# Patient Record
Sex: Female | Born: 1970
Health system: Southern US, Community
[De-identification: ages and names within clinical notes are randomized; demographics above are authoritative.]

## PROBLEM LIST (undated history)

## (undated) DIAGNOSIS — D649 Anemia, unspecified: Secondary | ICD-10-CM

## (undated) DIAGNOSIS — J189 Pneumonia, unspecified organism: Secondary | ICD-10-CM

## (undated) HISTORY — PX: ABDOMINAL HYSTERECTOMY: SHX81

---

## 1992-10-15 DIAGNOSIS — J189 Pneumonia, unspecified organism: Secondary | ICD-10-CM

## 1992-10-15 HISTORY — DX: Pneumonia, unspecified organism: J18.9

## 1998-08-12 ENCOUNTER — Other Ambulatory Visit: Admission: RE | Admit: 1998-08-12 | Discharge: 1998-08-12 | Payer: Self-pay | Admitting: Obstetrics and Gynecology

## 1999-09-12 ENCOUNTER — Other Ambulatory Visit: Admission: RE | Admit: 1999-09-12 | Discharge: 1999-09-12 | Payer: Self-pay | Admitting: *Deleted

## 2000-09-24 ENCOUNTER — Other Ambulatory Visit: Admission: RE | Admit: 2000-09-24 | Discharge: 2000-09-24 | Payer: Self-pay | Admitting: *Deleted

## 2001-10-15 HISTORY — PX: RHINOPLASTY: SUR1284

## 2001-10-28 ENCOUNTER — Other Ambulatory Visit: Admission: RE | Admit: 2001-10-28 | Discharge: 2001-10-28 | Payer: Self-pay | Admitting: Obstetrics and Gynecology

## 2002-10-30 ENCOUNTER — Other Ambulatory Visit: Admission: RE | Admit: 2002-10-30 | Discharge: 2002-10-30 | Payer: Self-pay | Admitting: Obstetrics and Gynecology

## 2003-11-03 ENCOUNTER — Other Ambulatory Visit: Admission: RE | Admit: 2003-11-03 | Discharge: 2003-11-03 | Payer: Self-pay | Admitting: Obstetrics and Gynecology

## 2006-02-12 ENCOUNTER — Encounter: Admission: RE | Admit: 2006-02-12 | Discharge: 2006-02-12 | Payer: Self-pay | Admitting: Obstetrics & Gynecology

## 2008-08-25 ENCOUNTER — Encounter: Admission: RE | Admit: 2008-08-25 | Discharge: 2008-08-25 | Payer: Self-pay | Admitting: Certified Nurse Midwife

## 2009-10-15 HISTORY — PX: TUBAL LIGATION: SHX77

## 2009-10-15 HISTORY — PX: NOVASURE ABLATION: SHX5394

## 2010-06-08 ENCOUNTER — Encounter: Admission: RE | Admit: 2010-06-08 | Discharge: 2010-06-08 | Payer: Self-pay | Admitting: Obstetrics & Gynecology

## 2010-10-05 ENCOUNTER — Ambulatory Visit (HOSPITAL_COMMUNITY)
Admission: RE | Admit: 2010-10-05 | Discharge: 2010-10-05 | Payer: Self-pay | Source: Home / Self Care | Attending: Obstetrics | Admitting: Obstetrics

## 2010-12-25 LAB — SURGICAL PCR SCREEN
MRSA, PCR: NEGATIVE
Staphylococcus aureus: NEGATIVE

## 2010-12-25 LAB — ABO/RH: ABO/RH(D): A POS

## 2010-12-25 LAB — CBC
HCT: 34.1 % — ABNORMAL LOW (ref 36.0–46.0)
Hemoglobin: 11.3 g/dL — ABNORMAL LOW (ref 12.0–15.0)

## 2011-04-25 ENCOUNTER — Encounter (HOSPITAL_COMMUNITY): Payer: Self-pay

## 2011-04-25 ENCOUNTER — Encounter (HOSPITAL_COMMUNITY)
Admission: RE | Admit: 2011-04-25 | Discharge: 2011-04-25 | Disposition: A | Discharge status: Home / Self Care | Payer: 59 | Source: Ambulatory Visit | Attending: Obstetrics | Admitting: Obstetrics

## 2011-04-25 HISTORY — DX: Pneumonia, unspecified organism: J18.9

## 2011-04-25 HISTORY — DX: Anemia, unspecified: D64.9

## 2011-04-25 LAB — CBC
HCT: 34.7 % — ABNORMAL LOW (ref 36.0–46.0)
MCV: 93.5 fL (ref 78.0–100.0)
Platelets: 245 10*3/uL (ref 150–400)
RDW: 14 % (ref 11.5–15.5)

## 2011-04-25 LAB — BASIC METABOLIC PANEL
GFR calc non Af Amer: >60 mL/min (ref >60)
Glucose, Bld: 85 mg/dL (ref 70–99)
Potassium: 4.5 mEq/L (ref 3.5–5.1)

## 2011-04-25 LAB — SURGICAL PCR SCREEN: MRSA, PCR: NEGATIVE

## 2011-04-25 NOTE — Patient Instructions (Signed)
19 MYR[address] DAHAN  04/25/2011   Your procedure is scheduled on:  05/01/2011  Report to [facility] at 1:00 PM.  Call this number if you have problems the morning of surgery: [phone]   Remember:   Do not eat food:After Midnight.  Do not drink clear liquids: 4 Hours before arrival.  Take these medicines the morning of surgery with A SIP OF WATER: None   Do not wear jewelry, make-up or nail polish.  Do not bring valuables to the hospital.  Contacts, dentures or bridgework may not be worn into surgery.  Leave suitcase in the car. After surgery it may be brought to your room.  For patients admitted to the hospital, checkout time is 11:00 AM the day of discharge.   Patients discharged the day of surgery will not be allowed to drive home.  Name and phone number of your driver: Khila  Special Instructions:Hibiclens bath the night before your surgery and the morning of your surgery.   Please read over the following fact sheets that you were given: Pain Booklet and MRSA Information

## 2011-05-01 ENCOUNTER — Encounter (HOSPITAL_COMMUNITY): Payer: Self-pay | Admitting: Anesthesiology

## 2011-05-01 ENCOUNTER — Ambulatory Visit (HOSPITAL_COMMUNITY): Payer: 59 | Admitting: Anesthesiology

## 2011-05-01 ENCOUNTER — Encounter (HOSPITAL_COMMUNITY)
Admission: RE | Disposition: A | Discharge status: Home / Self Care | Payer: Self-pay | Source: Ambulatory Visit | Attending: Obstetrics

## 2011-05-01 ENCOUNTER — Encounter (HOSPITAL_COMMUNITY): Payer: Self-pay | Admitting: Obstetrics

## 2011-05-01 ENCOUNTER — Ambulatory Visit (HOSPITAL_COMMUNITY)
Admission: RE | Admit: 2011-05-01 | Discharge: 2011-05-02 | Disposition: A | Discharge status: Home / Self Care | Payer: 59 | Source: Ambulatory Visit | Attending: Obstetrics | Admitting: Obstetrics

## 2011-05-01 ENCOUNTER — Other Ambulatory Visit: Payer: Self-pay | Admitting: Obstetrics

## 2011-05-01 DIAGNOSIS — R102 Pelvic and perineal pain unspecified side: Secondary | ICD-10-CM | POA: Diagnosis present

## 2011-05-01 DIAGNOSIS — Z01818 Encounter for other preprocedural examination: Secondary | ICD-10-CM | POA: Insufficient documentation

## 2011-05-01 DIAGNOSIS — N949 Unspecified condition associated with female genital organs and menstrual cycle: Secondary | ICD-10-CM | POA: Insufficient documentation

## 2011-05-01 DIAGNOSIS — N857 Hematometra: Secondary | ICD-10-CM | POA: Diagnosis present

## 2011-05-01 DIAGNOSIS — Z01812 Encounter for preprocedural laboratory examination: Secondary | ICD-10-CM | POA: Insufficient documentation

## 2011-05-01 LAB — TYPE AND SCREEN: Antibody Screen: NEGATIVE

## 2011-05-01 LAB — PREGNANCY, URINE: Preg Test, Ur: NEGATIVE

## 2011-05-01 SURGERY — ROBOTIC ASSISTED TOTAL HYSTERECTOMY
Anesthesia: General | Site: Abdomen | Wound class: Clean Contaminated

## 2011-05-01 MED ORDER — CEFAZOLIN SODIUM 1-5 GM-% IV SOLN
INTRAVENOUS | Status: DC | PRN
  Administered 2011-05-01: 1 g via INTRAVENOUS

## 2011-05-01 MED ORDER — FENTANYL CITRATE 0.05 MG/ML IJ SOLN
INTRAMUSCULAR | Status: DC | PRN
  Administered 2011-05-01: 100 ug via INTRAVENOUS
  Administered 2011-05-01: 50 ug via INTRAVENOUS
  Administered 2011-05-01: 100 ug via INTRAVENOUS

## 2011-05-01 MED ORDER — LACTATED RINGERS IV SOLN
INTRAVENOUS | Status: DC
  Administered 2011-05-01: 13:00:00 via INTRAVENOUS

## 2011-05-01 MED ORDER — KETOROLAC TROMETHAMINE 30 MG/ML IJ SOLN
30.0000 mg | Freq: Four times a day (QID) | INTRAMUSCULAR | Status: DC
  Administered 2011-05-01 – 2011-05-02 (×2): 30 mg via INTRAVENOUS
  Filled 2011-05-01 (×2): qty 1

## 2011-05-01 MED ORDER — HYDROMORPHONE HCL 1 MG/ML IJ SOLN
INTRAMUSCULAR | Status: DC | PRN
  Administered 2011-05-01: 1 mg via INTRAVENOUS

## 2011-05-01 MED ORDER — MORPHINE SULFATE 2 MG/ML IJ SOLN: 1.0000 mg | INTRAMUSCULAR | Status: DC | PRN

## 2011-05-01 MED ORDER — LACTATED RINGERS IR SOLN
Status: DC | PRN
  Administered 2011-05-01: 3000 mL

## 2011-05-01 MED ORDER — SCOPOLAMINE 1 MG/3DAYS TD PT72
MEDICATED_PATCH | TRANSDERMAL | Status: AC
  Filled 2011-05-01: qty 1

## 2011-05-01 MED ORDER — ONDANSETRON HCL 4 MG/2ML IJ SOLN
INTRAMUSCULAR | Status: DC | PRN
  Administered 2011-05-01: 4 mg via INTRAVENOUS

## 2011-05-01 MED ORDER — FENTANYL CITRATE 0.05 MG/ML IJ SOLN
25.0000 ug | INTRAMUSCULAR | Status: DC | PRN
  Administered 2011-05-01 (×4): 50 ug via INTRAVENOUS

## 2011-05-01 MED ORDER — ACETAMINOPHEN 325 MG PO TABS: 650.0000 mg | ORAL_TABLET | ORAL | Status: DC | PRN

## 2011-05-01 MED ORDER — ZOLPIDEM TARTRATE 5 MG PO TABS: 5.0000 mg | ORAL_TABLET | Freq: Every evening | ORAL | Status: DC | PRN

## 2011-05-01 MED ORDER — BUPIVACAINE HCL (PF) 0.25 % IJ SOLN
INTRAMUSCULAR | Status: DC | PRN
  Administered 2011-05-01: 14 mL

## 2011-05-01 MED ORDER — ONDANSETRON HCL 4 MG PO TABS: 4.0000 mg | ORAL_TABLET | Freq: Four times a day (QID) | ORAL | Status: DC | PRN

## 2011-05-01 MED ORDER — FENTANYL CITRATE 0.05 MG/ML IJ SOLN
INTRAMUSCULAR | Status: AC
  Administered 2011-05-01: 50 ug via INTRAVENOUS
  Filled 2011-05-01: qty 2

## 2011-05-01 MED ORDER — LIDOCAINE HCL (CARDIAC) 20 MG/ML IV SOLN
INTRAVENOUS | Status: DC | PRN
  Administered 2011-05-01: 80 mg via INTRAVENOUS

## 2011-05-01 MED ORDER — LACTATED RINGERS IV SOLN
INTRAVENOUS | Status: DC | PRN
  Administered 2011-05-01 (×3): via INTRAVENOUS

## 2011-05-01 MED ORDER — KETOROLAC TROMETHAMINE 30 MG/ML IJ SOLN
30.0000 mg | Freq: Once | INTRAMUSCULAR | Status: AC
  Administered 2011-05-01: 30 mg via INTRAVENOUS

## 2011-05-01 MED ORDER — SCOPOLAMINE 1 MG/3DAYS TD PT72: 1.0000 | MEDICATED_PATCH | Freq: Once | TRANSDERMAL | Status: DC

## 2011-05-01 MED ORDER — SODIUM CHLORIDE 0.9 % IV SOLN
INTRAVENOUS | Status: AC
  Administered 2011-05-01: 19:00:00 via INTRAVENOUS

## 2011-05-01 MED ORDER — MORPHINE SULFATE 4 MG/ML IJ SOLN: 1.0000 mg | INTRAMUSCULAR | Status: DC | PRN

## 2011-05-01 MED ORDER — ROCURONIUM BROMIDE 100 MG/10ML IV SOLN
INTRAVENOUS | Status: DC | PRN
  Administered 2011-05-01: 10 mg via INTRAVENOUS
  Administered 2011-05-01: 50 mg via INTRAVENOUS
  Administered 2011-05-01: 10 mg via INTRAVENOUS

## 2011-05-01 MED ORDER — ONDANSETRON HCL 4 MG/2ML IJ SOLN: 4.0000 mg | Freq: Four times a day (QID) | INTRAMUSCULAR | Status: DC | PRN

## 2011-05-01 MED ORDER — PROPOFOL 10 MG/ML IV EMUL
INTRAVENOUS | Status: DC | PRN
  Administered 2011-05-01: 200 mg via INTRAVENOUS
  Administered 2011-05-01: 100 mg via INTRAVENOUS

## 2011-05-01 MED ORDER — NEOSTIGMINE METHYLSULFATE 1 MG/ML IJ SOLN
INTRAMUSCULAR | Status: DC | PRN
  Administered 2011-05-01: 3 mg via INTRAMUSCULAR

## 2011-05-01 MED ORDER — MEPERIDINE HCL 25 MG/ML IJ SOLN: 6.2500 mg | INTRAMUSCULAR | Status: DC | PRN

## 2011-05-01 MED ORDER — OXYCODONE-ACETAMINOPHEN 5-325 MG PO TABS
1.0000 | ORAL_TABLET | ORAL | Status: DC | PRN
  Administered 2011-05-01: 1 via ORAL
  Administered 2011-05-02: 2 via ORAL
  Filled 2011-05-01: qty 1
  Filled 2011-05-01: qty 2

## 2011-05-01 MED ORDER — MORPHINE SULFATE 4 MG/ML IJ SOLN
2.0000 mg | Freq: Once | INTRAMUSCULAR | Status: AC
  Administered 2011-05-01: 2 mg via INTRAVENOUS
  Filled 2011-05-01: qty 1

## 2011-05-01 MED ORDER — KETOROLAC TROMETHAMINE 30 MG/ML IJ SOLN
INTRAMUSCULAR | Status: AC
  Administered 2011-05-01: 30 mg via INTRAVENOUS
  Filled 2011-05-01: qty 1

## 2011-05-01 MED ORDER — CEFAZOLIN SODIUM 1-5 GM-% IV SOLN: 1.0000 g | INTRAVENOUS | Status: DC

## 2011-05-01 MED ORDER — GLYCOPYRROLATE 0.2 MG/ML IJ SOLN
INTRAMUSCULAR | Status: DC | PRN
  Administered 2011-05-01: .4 mg via INTRAVENOUS

## 2011-05-01 MED ORDER — MENTHOL 3 MG MT LOZG: 1.0000 | LOZENGE | OROMUCOSAL | Status: DC | PRN

## 2011-05-01 MED ORDER — KETOROLAC TROMETHAMINE 30 MG/ML IJ SOLN: 30.0000 mg | Freq: Four times a day (QID) | INTRAMUSCULAR | Status: DC

## 2011-05-01 MED ORDER — MIDAZOLAM HCL 5 MG/5ML IJ SOLN
INTRAMUSCULAR | Status: DC | PRN
  Administered 2011-05-01: 2 mg via INTRAVENOUS

## 2011-05-01 MED ORDER — DEXAMETHASONE SODIUM PHOSPHATE 10 MG/ML IJ SOLN
INTRAMUSCULAR | Status: DC | PRN
  Administered 2011-05-01: 10 mg via INTRAVENOUS

## 2011-05-01 SURGICAL SUPPLY — 66 items
BARRIER ADHS 3X4 INTERCEED (GAUZE/BANDAGES/DRESSINGS) ×1 IMPLANT
BLADE LAPAROSCOPIC MORCELL KIT (BLADE) ×1 IMPLANT
BLADELESS LONG 8MM (BLADE) IMPLANT
BRR ADH 4X3 ABS CNTRL BYND (GAUZE/BANDAGES/DRESSINGS)
CABLE HIGH FREQUENCY MONO STRZ (ELECTRODE) ×3 IMPLANT
CHLORAPREP W/TINT 26ML (MISCELLANEOUS) ×3 IMPLANT
CLOTH BEACON ORANGE TIMEOUT ST (SAFETY) ×3 IMPLANT
CONT PATH 16OZ SNAP LID 3702 (MISCELLANEOUS) ×3 IMPLANT
COVER MAYO STAND STRL (DRAPES) ×3 IMPLANT
COVER TABLE BACK 60X90 (DRAPES) ×6 IMPLANT
COVER TIP SHEARS 8 DVNC (MISCELLANEOUS) ×2 IMPLANT
COVER TIP SHEARS 8MM DA VINCI (MISCELLANEOUS) ×1
DECANTER SPIKE VIAL GLASS SM (MISCELLANEOUS) ×3 IMPLANT
DERMABOND ADVANCED (GAUZE/BANDAGES/DRESSINGS) ×6 IMPLANT
DRAPE HUG U DISPOSABLE (DRAPE) ×3 IMPLANT
DRAPE HYSTEROSCOPY (DRAPE) IMPLANT
DRAPE LG THREE QUARTER DISP (DRAPES) ×6 IMPLANT
DRAPE MONITOR DA VINCI (DRAPE) ×3 IMPLANT
DRAPE UTILITY XL STRL (DRAPES) ×1 IMPLANT
DRAPE WARM FLUID 44X44 (DRAPE) ×3 IMPLANT
ELECT REM PT RETURN 9FT ADLT (ELECTROSURGICAL) ×3
ELECTRODE REM PT RTRN 9FT ADLT (ELECTROSURGICAL) ×2 IMPLANT
EVACUATOR SMOKE 8.L (FILTER) ×3 IMPLANT
GAUZE VASELINE 3X9 (GAUZE/BANDAGES/DRESSINGS) IMPLANT
GLOVE BIO SURGEON STRL SZ 6.5 (GLOVE) ×7 IMPLANT
GLOVE BIOGEL PI IND STRL 7.0 (GLOVE) ×4 IMPLANT
GLOVE BIOGEL PI INDICATOR 7.0 (GLOVE) ×2
GOWN BRE IMP SLV AUR LG STRL (GOWN DISPOSABLE) ×16 IMPLANT
IV STOPCOCK 4 WAY 40  W/Y SET (IV SOLUTION)
IV STOPCOCK 4 WAY 40 W/Y SET (IV SOLUTION) ×1 IMPLANT
KIT DISP ACCESSORY 4 ARM (KITS) ×3 IMPLANT
NDL INSUFFLATION 14GA 120MM (NEEDLE) IMPLANT
NEEDLE HYPO 22GX1.5 SAFETY (NEEDLE) IMPLANT
NEEDLE INSUFFLATION 14GA 120MM (NEEDLE) ×3 IMPLANT
NS IRRIG 1000ML POUR BTL (IV SOLUTION) ×3 IMPLANT
OCCLUDER COLPOPNEUMO (BALLOONS) ×2 IMPLANT
PACK LAVH (CUSTOM PROCEDURE TRAY) ×3 IMPLANT
PAD PREP 24X48 CUFFED NSTRL (MISCELLANEOUS) ×6 IMPLANT
POSITIONER SURGICAL ARM (MISCELLANEOUS) ×10 IMPLANT
SET IRRIG TUBING LAPAROSCOPIC (IRRIGATION / IRRIGATOR) ×4 IMPLANT
SOLUTION ELECTROLUBE (MISCELLANEOUS) ×3 IMPLANT
SPONGE LAP 18X18 X RAY DECT (DISPOSABLE) IMPLANT
SUT VIC AB 0 CT1 27 (SUTURE) ×15
SUT VIC AB 0 CT1 27XBRD ANTBC (SUTURE) ×10 IMPLANT
SUT VIC AB 0 CT2 27 (SUTURE) ×6 IMPLANT
SUT VIC AB 2-0 CT1 27 (SUTURE)
SUT VIC AB 2-0 CT1 TAPERPNT 27 (SUTURE) IMPLANT
SUT VIC AB 4-0 PS2 27 (SUTURE) ×6 IMPLANT
SUT VICRYL 0 UR6 27IN ABS (SUTURE) ×6 IMPLANT
SYR 50ML LL SCALE MARK (SYRINGE) ×3 IMPLANT
SYR 50ML SLIP (SYRINGE) IMPLANT
SYSTEM CONVERTIBLE TROCAR (TROCAR) IMPLANT
TIP UTERINE 5.1X6CM LAV DISP (MISCELLANEOUS) IMPLANT
TIP UTERINE 6.7X10CM GRN DISP (MISCELLANEOUS) IMPLANT
TIP UTERINE 6.7X6CM WHT DISP (MISCELLANEOUS) IMPLANT
TIP UTERINE 6.7X8CM BLUE DISP (MISCELLANEOUS) ×2 IMPLANT
TOWEL OR 17X24 6PK STRL BLUE (TOWEL DISPOSABLE) ×9 IMPLANT
TRAY FOLEY BAG SILVER LF 14FR (CATHETERS) ×3 IMPLANT
TROCAR 12M 150ML BLUNT (TROCAR) ×2 IMPLANT
TROCAR DISP BLADELESS 8 DVNC (TROCAR) ×2 IMPLANT
TROCAR DISP BLADELESS 8MM (TROCAR) ×1
TROCAR XCEL 12X100 BLDLESS (ENDOMECHANICALS) ×3 IMPLANT
TROCAR Z-THREAD BLADED 12X100M (TROCAR) ×3 IMPLANT
TROCAR Z-THREAD FIOS 5X100MM (TROCAR) ×2 IMPLANT
TUBING FILTER THERMOFLATOR (ELECTROSURGICAL) ×4 IMPLANT
WARMER LAPAROSCOPE (MISCELLANEOUS) ×3 IMPLANT

## 2011-05-01 NOTE — Anesthesia Postprocedure Evaluation (Signed)
Vital signs stable Patient alert Pain and nausea are controlled No apparent anesthetic complications No follow up care needed 

## 2011-05-01 NOTE — Anesthesia Procedure Notes (Addendum)
Procedure Name: Intubation Date/Time: 05/01/2011 1:37 PM Performed by: Truitt Leep Pre-anesthesia Checklist: Patient identified, Emergency Drugs available, Suction available, Patient being monitored and Timeout performed Patient Re-evaluated:Patient Re-evaluated prior to inductionOxygen Delivery Method: Circle System Utilized Preoxygenation: Pre-oxygenation with 100% oxygen Intubation Type: IV induction Laryngoscope Size: Mac and 3 Grade View: Grade II Tube type: Oral Tube size: 7.0 mm Number of attempts: 1 Airway Equipment and Method: stylet Placement Confirmation: ETT inserted through vocal cords under direct vision,  positive ETCO2,  CO2 detector and breath sounds checked- equal and bilateral Secured at: 20 cm

## 2011-05-01 NOTE — H&P (Addendum)
Please see H&P from office under media tab. No new changes In short 6 months of pelvic pain and hematometra since Novasure ablation and lap TL. All other options exhausted and/or declined and pt desiring hysterectomy.  Darrold Span, MD  History and Physical Interval Note:   05/01/2011   6:25 PM   Theresa Mcclure  has presented today for surgery, with the diagnosis of pelvic pain;failed ablation  The various methods of treatment have been discussed with the patient and family. After consideration of risks, benefits and other options for treatment, the patient has consented to  Procedure(s): ROBOTIC ASSISTED TOTAL HYSTERECTOMY as a surgical intervention .  I have reviewed the patients' chart and labs.  Questions were answered to the patient's satisfaction.     Lendon Colonel  MD

## 2011-05-01 NOTE — Brief Op Note (Signed)
05/01/2011  5:18 PM  PATIENT:  Theresa Mcclure  40 y.o. female  PRE-OPERATIVE DIAGNOSIS:  pelvic pain;failed ablation  POST-OPERATIVE DIAGNOSIS:  same, hematometra, anterior omental adhesions  PROCEDURE:  Procedure(s): ROBOTIC ASSISTED TOTAL HYSTERECTOMY, b/l salpingectomy, lysis of adhesions  SURGEON:  Surgeon(s): Ryllie Nieland A. Bodin Gorka Vaishali R Mody  PHYSICIAN ASSISTANT:   ASSISTANTS: Hilary Hertz, MD   ANESTHESIA:   general  ESTIMATED BLOOD LOSS: 50cc  BLOOD ADMINISTERED:none  DRAINS: Urinary Catheter (Foley)   LOCAL MEDICATIONS USED:  MARCAINE 20 CC   SPECIMEN:  Source of Specimen:  uterus, cervix and b/l fallopian tubes  DISPOSITION OF SPECIMEN:  PATHOLOGY  COUNTS:  YES  TOURNIQUET:  * No tourniquets in log *  DICTATION #: [id number]  PLAN OF CARE: Routine post-op, admit overnight, prn pain meds, advance diet as tolerated, plan d/c home in am. Cont foley overnight and remove 6 am.   PATIENT DISPOSITION:  PACU - hemodynamically stable.   Delay start of Pharmacological VTE agent (>24hrs) due to surgical blood loss or risk of bleeding:  Yes, plan SCDs while in bed

## 2011-05-01 NOTE — Anesthesia Preprocedure Evaluation (Deleted)
Anesthesia Evaluation Anesthesia Physical Anesthesia Plan Anesthesia Quick Evaluation  

## 2011-05-01 NOTE — OR Nursing (Signed)
Rumi- [phone] Robotic times

## 2011-05-01 NOTE — Anesthesia Preprocedure Evaluation (Addendum)
Anesthesia Evaluation  Name, MR# and DOB Patient awake  General Assessment Comment  Reviewed: Allergy & Precautions, H&P  and Patient's Chart, lab work & pertinent test results  Airway Mallampati: II TM Distance: >3 FB Neck ROM: Full    Dental No notable dental hx (+) Teeth Intact   Pulmonaryneg pulmonary ROS  asthma    pulmonary exam normal   Cardiovascular Regular Normal   Neuro/PsychNegative Neurological ROS Negative Psych ROS  GI/Hepatic/Renal negative GI ROS, negative Liver ROS, and negative Renal ROS (+)       Endo/Other  Negative Endocrine ROS (+)   Abdominal   Musculoskeletal negative musculoskeletal ROS (+)  Hematology negative hematology ROS (+)   Peds  Reproductive/Obstetrics negative OB ROS   Anesthesia Other Findings             Anesthesia Physical Anesthesia Plan  ASA: I  Anesthesia Plan: General   Post-op Pain Management:    Induction: Intravenous  Airway Management Planned: Oral ETT  Additional Equipment:   Intra-op Plan:   Post-operative Plan: Extubation in OR  Informed Consent: I have reviewed the patients History and Physical, chart, labs and discussed the procedure including the risks, benefits and alternatives for the proposed anesthesia with the patient or authorized representative who has indicated his/her understanding and acceptance.   Dental advisory given  Plan Discussed with: Anesthesiologist (AP) and CRNA  Anesthesia Plan Comments:         Anesthesia Quick Evaluation

## 2011-05-01 NOTE — Transfer of Care (Signed)
Immediate Anesthesia Transfer of Care Note  Patient: Theresa Mcclure  Procedure(s) Performed:  ROBOTIC ASSISTED TOTAL HYSTERECTOMY - Lysis of adhesions, Bilateral Salpingectomy  Patient Location: PACU  Anesthesia Type: General  Level of Consciousness: awake, alert  and oriented  Airway & Oxygen Therapy: Patient Spontanous Breathing and Patient connected to nasal cannula oxygen  Post-op Assessment: Report given to PACU RN  Post vital signs: stable  Complications: No apparent anesthesia complications

## 2011-05-02 LAB — CBC
MCH: 30.5 pg (ref 26.0–34.0)
MCHC: 33.1 g/dL (ref 30.0–36.0)
Platelets: 235 10*3/uL (ref 150–400)
RBC: 3.15 MIL/uL — ABNORMAL LOW (ref 3.87–5.11)

## 2011-05-02 MED ORDER — OXYCODONE-ACETAMINOPHEN 5-325 MG PO TABS: 2.0000 | ORAL_TABLET | ORAL | Status: AC | PRN

## 2011-05-02 NOTE — Progress Notes (Signed)
1 Day Post-Op Procedure(s) (LRB): ROBOTIC ASSISTED TOTAL HYSTERECTOMY (N/A)  Subjective: Patient reports no problems voiding, tol reg po, no N/V. Pain controlled w/ po meds. Ambulating well. Vaginal spotting with wiping.  Ready for d/c.      Objective: I have reviewed patient's vital signs and labs.  General: alert and cooperative Resp: clear to auscultation bilaterally Cardio: regular rate and rhythm, S1, S2 normal, no murmur, click, rub or gallop GI: soft, slight distension, slight tympany, apprropriate tenderness POD ! Extremities: extremities normal, atraumatic, no cyanosis or edema Incisions healing well, minimal bruising  Assessment: s/p Procedure(s): ROBOTIC ASSISTED TOTAL HYSTERECTOMY: stable, progressing well and tolerating diet  Plan: Discharge home  LOS: 1 day    Ulice Follett A. 05/02/2011, 8:23 AM

## 2011-05-02 NOTE — Op Note (Signed)
Theresa Mcclure, Theresa Mcclure NO.:  [id number]  MEDICAL RECORD NO.:  [id number]  LOCATION:  9315                          FACILITY:  [facility]  PHYSICIAN:  Lendon Colonel, MD   DATE OF BIRTH:  March 29, 1971  DATE OF PROCEDURE:  05/01/2011 DATE OF DISCHARGE:                              OPERATIVE REPORT   PREOPERATIVE DIAGNOSES:  Pelvic pain, hematometra.  POSTOPERATIVE DIAGNOSIS:  Pelvic pain, hematometra.  PROCEDURE:  Robotic-assisted total laparoscopic hysterectomy.  SURGEON:  Lendon Colonel, MD  ASSISTANT:  Darryl Nestle, MD  ANESTHESIA:  General.  ESTIMATED BLOOD LOSS:  50 mL.  ANESTHESIA:  General.  FINDINGS:  Normal right and left ovary.  No evidence of ovarian cyst. Irregular-shaped uterus with bulge on the anterior abdominal wall filled with blood, evidence of prior tubal ligation, omental adhesion to the anterior abdominal wall.  ANESTHESIA:  General.  INDICATIONS:  This is a 40 year old, G2, P1 patient who is 6 months status post laparoscopic tubal ligation and NovaSure endometrial ablation.  Initial surgery was done for menorrhagia, however, postoperatively the patient began to develop cyclic debilitating monthly abdominal pain.  She was not having any vaginal bleeding at the time the patient had her pain.  Ultrasound showed a fluid-filled endometrium consistent with hematometra.  There was also at times hematosalpinx on ultrasound.  Additionally, there was question of a dermoid cyst on the right ovary.  The patient was consented for total laparoscopic hysterectomy with possible removal of the right ovarian cyst or right ovary.  PROCEDURE:  After informed consent was obtained, the patient was taken to the operating room where general anesthesia was initiated without difficulty.  She was prepped and draped in the normal sterile fashion in the dorsal supine lithotomy position.  A bimanual examination was done to assess the size and position of the  uterus.  A weighted speculum was placed in the vagina.  The cervix was grasped with a single-tooth tenaculum and the uterus was sounded to 8.5 cm.  The cervix was dilated.  The cervix was incised and decision was made to use a RUMI Koh medium ring with a #8 trocar.  This was inserted into the uterus without complication.  Foley catheter was inserted before any vaginal manipulation.  Gloves were changed and attention was turned to the patient's abdomen.  Given the prior surgery noting omental adhesions in the anterior abdominal wall, decision was made to start with a left upper quadrant port.  NG tube was in place.  A 5-mm skin incision was made after first infusing with 0.5% Marcaine in the left upper quadrant, Veress needle was inserted. Pneumoperitoneum was created to 15 mmHg.  A #5 non-bladed Optiview trocar was then placed under direct visualization.  Once in the abdomen, pneumoperitoneum was maintained.  Brief survey of the abdomen and pelvis was noted with finding of a large omental adhesion to the anterior abdominal wall and otherwise misshapen uterus with findings as above. Both ureters were visualized and seen starting at the pelvic brim crossing down into the pelvis.  Ovaries were evaluated and felt to be normal.  Decision was made to place additional ports.  The 8-mm skin incisions were made  after first infusing with 0.5% Marcaine and then direct insertion of non-bladed 8-mm trocars were made, one in the left lower quadrant, two in the right lower quadrant, and a 12 bladed trocar was used to enter at the abdomen around the omental adhesion.  Decision was then to take down the anterior omental adhesion with laparoscopy using a monopolar scissors and a Maryland grasper for traction.  Serial cautery and transection of the omental bands from the umbilical area was done.  After this was clear, the robot was docked to the ports and instruments were placed under direct visualization.   At this time, I moved to the robotic console.  Using robotic instruments, additional evaluation of the patient's abdomen and pelvis revealed findings as above.  The ureters were seen again.  We began on the right side.  The uterus was manipulated to the anterior abdominal wall into the patient's left and the right round ligament was grasped, cauterized, and transected.  Dissection was carried out along the anterior leaf of broad ligament until the bladder flap and bladder flap was begun to be created.  The uteroovarian ligament was serially cauterized and transected.  The tube was pulled up and the mesosalpinx was serially cauterized and transected so that the fallopian tube would be removed with the uterine specimen.  Some dissection along the posterior leaf of the broad ligament was also done until the ovary dropped away into the side.  Once hemostasis was noted, the uterus was deviated towards the patient's right.  The left round ligament was grasped, serially cauterized, and transected.  Some anterior dissection of the leaf of the broad ligament was done and carried around to the bladder flap.  This was done with the monopolar cautery and the bladder flap was created. The left uteroovarian ligament was serially cauterized and transected. The left mesosalpinx was serially cauterized and transected so that the entire fallopian tube remained with the uterine specimen.  Additional dissection of the anterior and posterior leaf of the broad ligaments were carried out and once the ovary had fallen away to the left side we focused again on the right side.  The right ovary was held up given the prior ultrasound with concern for possible dermoid.  The ovary was fully evaluated.  There was one small area which appeared to be cystic and this was cauterized and transected.  No cyst fluid could be seen.  The ovary appeared normal without excrescences, without any worrisome findings and decision was  made to leave the ovary being, not plan oophorectomy or further dissection into the ovary to find any small cyst that might be present.  With the uterus on stretch and the Koh ring pushed into the pelvis, the right uterine artery was serially cauterized and transected.  Additional dissection was done on the bladder flap and good blanching was seen of the uterus.  The uterus was stretched to the right and the left uterine artery was serially cauterized and transected.  Once good blanching of the uterus was noted, we began the dissection along the Koh ring using the monopolar scissors to transect the uterus at the cervicovaginal junction.  Good hemostasis was noted from the cuff; and the uterus, cervix, and bilateral fallopian tubes were pushed out of the vagina.  Good hemostasis was noted.  Irrigation was done.  At this point, two CT1 Vicryl sutures were placed through the umbilical port.  The camera was removed.  The sutures were placed.  The camera was replaced.  Both  needles were then grasped and anchored to the anterior abdominal wall, and these CT1 sutures were used to throw figure- of-eight sutures at the bilateral angles of the vaginal cuff.  After the sutures were tightened and the suture was cut, the sutures were then again anchored to the anterior abdominal wall.  Decision was then made to proceed with CT2 sutures.  Serially single CT suture was placed to the left lower quadrant port and grasped with the robotic instrument and used to close the cuff.  Three additional figure-of-eight sutures were placed along the vaginal cuff.  Excellent hemostasis was noted from the vaginal cuff.  All cut pedicles were inspected and found to be hemostatic.  As the sutures that were anchored into the anterior abdominal wall were removed, some bleeding was noted and bipolar cautery was used to control bleeding at these sites.  The robot was removed. The laparoscopy was used to remove the two  additional sutures that were anchored in the anterior abdominal wall.  Irrigation was carried out. The patient was placed in reverse Trendelenburg.  Again, irrigation was done.  All cut pedicles and vaginal cuff remained hemostatic, and all instruments were then removed.  The umbilical incision and its fascial incision were closed with a single suture of 0 Vicryl.  The 4-0 Vicryl was then used to close the subcu layers on the all ports.  The patient tolerated the procedure well.  Sponge, lap, and needle counts were correct x3 and the patient was taken to the recovery room in stable condition.     Lendon Colonel, MD     KAF/MEDQ  D:  05/01/2011  T:  05/02/2011  Job:  [id number]

## 2013-05-18 ENCOUNTER — Other Ambulatory Visit: Payer: Self-pay | Admitting: Obstetrics and Gynecology

## 2013-05-18 ENCOUNTER — Other Ambulatory Visit: Payer: Self-pay | Admitting: Obstetrics & Gynecology

## 2013-05-18 DIAGNOSIS — Z1231 Encounter for screening mammogram for malignant neoplasm of breast: Secondary | ICD-10-CM

## 2013-06-04 ENCOUNTER — Ambulatory Visit
Admission: RE | Admit: 2013-06-04 | Discharge: 2013-06-04 | Disposition: A | Discharge status: Home / Self Care | Payer: 59 | Source: Ambulatory Visit | Attending: Obstetrics and Gynecology | Admitting: Obstetrics and Gynecology

## 2013-06-04 DIAGNOSIS — Z1231 Encounter for screening mammogram for malignant neoplasm of breast: Secondary | ICD-10-CM

## 2017-03-05 ENCOUNTER — Emergency Department (HOSPITAL_COMMUNITY): Payer: Managed Care, Other (non HMO)

## 2017-03-05 ENCOUNTER — Encounter (HOSPITAL_COMMUNITY): Payer: Self-pay

## 2017-03-05 DIAGNOSIS — R079 Chest pain, unspecified: Secondary | ICD-10-CM | POA: Diagnosis not present

## 2017-03-05 DIAGNOSIS — Z5321 Procedure and treatment not carried out due to patient leaving prior to being seen by health care provider: Secondary | ICD-10-CM | POA: Insufficient documentation

## 2017-03-05 LAB — CBC
HEMATOCRIT: 35.3 % — AB (ref 36.0–46.0)
HEMOGLOBIN: 11.4 g/dL — AB (ref 12.0–15.0)
MCH: 29.5 pg (ref 26.0–34.0)
MCHC: 32.3 g/dL (ref 30.0–36.0)
MCV: 91.2 fL (ref 78.0–100.0)
Platelets: 232 10*3/uL (ref 150–400)
RBC: 3.87 MIL/uL (ref 3.87–5.11)
RDW: 13.6 % (ref 11.5–15.5)
WBC: 7.1 10*3/uL (ref 4.0–10.5)

## 2017-03-05 LAB — BASIC METABOLIC PANEL
ANION GAP: 6 (ref 5–15)
BUN: 15 mg/dL (ref 6–20)
CHLORIDE: 105 mmol/L (ref 101–111)
CO2: 23 mmol/L (ref 22–32)
Calcium: 9.1 mg/dL (ref 8.9–10.3)
Creatinine, Ser: 1 mg/dL (ref 0.44–1.00)
GFR calc Af Amer: >60 mL/min (ref >60)
GLUCOSE: 93 mg/dL (ref 65–99)
Potassium: 3.8 mmol/L (ref 3.5–5.1)
Sodium: 134 mmol/L — ABNORMAL LOW (ref 135–145)

## 2017-03-05 LAB — I-STAT TROPONIN, ED: Troponin i, poc: 0 ng/mL (ref 0.00–0.08)

## 2017-03-05 NOTE — ED Triage Notes (Signed)
Pt reports a dullness to the right side of her chest that radiates down to her right elbow, onset today around 1430 while she was sitting down at work. Denies SOB/n/v/d/diaphoresis. Pt sent from urgent care

## 2017-03-06 ENCOUNTER — Emergency Department (HOSPITAL_COMMUNITY)
Admission: EM | Admit: 2017-03-06 | Discharge: 2017-03-06 | Disposition: A | Discharge status: Home / Self Care | Payer: Managed Care, Other (non HMO) | Attending: Emergency Medicine | Admitting: Emergency Medicine

## 2017-03-06 NOTE — ED Triage Notes (Signed)
The pt is tired of waitinot angry jung  She is the wait   Time given  [facility] and [facility] for further treatemnt.. She has an appointment with her regular doctor  Later today  She asked if they could get her results and they can  She left  Not upset just tired of waiting

## 2018-11-04 DIAGNOSIS — J069 Acute upper respiratory infection, unspecified: Secondary | ICD-10-CM | POA: Diagnosis not present

## 2018-11-04 DIAGNOSIS — H1030 Unspecified acute conjunctivitis, unspecified eye: Secondary | ICD-10-CM | POA: Diagnosis not present

## 2018-11-24 DIAGNOSIS — Z1231 Encounter for screening mammogram for malignant neoplasm of breast: Secondary | ICD-10-CM | POA: Diagnosis not present

## 2018-11-24 DIAGNOSIS — Z6829 Body mass index (BMI) 29.0-29.9, adult: Secondary | ICD-10-CM | POA: Diagnosis not present

## 2018-11-24 DIAGNOSIS — Z01419 Encounter for gynecological examination (general) (routine) without abnormal findings: Secondary | ICD-10-CM | POA: Diagnosis not present

## 2019-05-15 ENCOUNTER — Other Ambulatory Visit: Payer: Self-pay

## 2019-05-15 DIAGNOSIS — R6889 Other general symptoms and signs: Secondary | ICD-10-CM | POA: Diagnosis not present

## 2019-05-15 DIAGNOSIS — Z20822 Contact with and (suspected) exposure to covid-19: Secondary | ICD-10-CM

## 2019-05-17 LAB — NOVEL CORONAVIRUS, NAA: SARS-CoV-2, NAA: NOT DETECTED

## 2019-05-25 ENCOUNTER — Other Ambulatory Visit: Payer: Self-pay | Admitting: Family Medicine

## 2019-05-25 ENCOUNTER — Ambulatory Visit
Admission: RE | Admit: 2019-05-25 | Discharge: 2019-05-25 | Disposition: A | Discharge status: Home / Self Care | Payer: BC Managed Care – PPO | Source: Ambulatory Visit | Attending: Family Medicine | Admitting: Family Medicine

## 2019-05-25 ENCOUNTER — Other Ambulatory Visit: Payer: Self-pay

## 2019-05-25 DIAGNOSIS — R0789 Other chest pain: Secondary | ICD-10-CM

## 2019-05-25 DIAGNOSIS — R0781 Pleurodynia: Secondary | ICD-10-CM | POA: Diagnosis not present

## 2019-09-21 DIAGNOSIS — Z03818 Encounter for observation for suspected exposure to other biological agents ruled out: Secondary | ICD-10-CM | POA: Diagnosis not present

## 2019-10-21 DIAGNOSIS — Z03818 Encounter for observation for suspected exposure to other biological agents ruled out: Secondary | ICD-10-CM | POA: Diagnosis not present

## 2019-12-04 DIAGNOSIS — Z01419 Encounter for gynecological examination (general) (routine) without abnormal findings: Secondary | ICD-10-CM | POA: Diagnosis not present

## 2019-12-04 DIAGNOSIS — B001 Herpesviral vesicular dermatitis: Secondary | ICD-10-CM | POA: Diagnosis not present

## 2019-12-04 DIAGNOSIS — Z6833 Body mass index (BMI) 33.0-33.9, adult: Secondary | ICD-10-CM | POA: Diagnosis not present

## 2019-12-04 DIAGNOSIS — N760 Acute vaginitis: Secondary | ICD-10-CM | POA: Diagnosis not present

## 2019-12-04 DIAGNOSIS — N952 Postmenopausal atrophic vaginitis: Secondary | ICD-10-CM | POA: Diagnosis not present

## 2019-12-04 DIAGNOSIS — Z1231 Encounter for screening mammogram for malignant neoplasm of breast: Secondary | ICD-10-CM | POA: Diagnosis not present

## 2019-12-04 DIAGNOSIS — F489 Nonpsychotic mental disorder, unspecified: Secondary | ICD-10-CM | POA: Diagnosis not present

## 2019-12-07 ENCOUNTER — Ambulatory Visit: Payer: BC Managed Care – PPO | Attending: Internal Medicine

## 2019-12-07 DIAGNOSIS — Z20822 Contact with and (suspected) exposure to covid-19: Secondary | ICD-10-CM | POA: Diagnosis not present

## 2019-12-08 LAB — NOVEL CORONAVIRUS, NAA: SARS-CoV-2, NAA: NOT DETECTED

## 2020-01-01 ENCOUNTER — Ambulatory Visit: Payer: BC Managed Care – PPO | Attending: Internal Medicine

## 2020-01-01 DIAGNOSIS — Z23 Encounter for immunization: Secondary | ICD-10-CM

## 2020-01-01 NOTE — Progress Notes (Signed)
   Covid-19 Vaccination Clinic  Name:  Theresa Mcclure    MRN: [id number] DOB: 12/31/1970  01/01/2020  [name] was observed post Covid-19 immunization for 15 minutes without incident. She was provided with Vaccine Information Sheet and instruction to access the V-Safe system.   [name] was instructed to call 911 with any severe reactions post vaccine: Marland Kitchen Difficulty breathing  . Swelling of face and throat  . A fast heartbeat  . A bad rash all over body  . Dizziness and weakness   Immunizations Administered    Name Date Dose VIS Date Route   Pfizer COVID-19 Vaccine 01/01/2020  2:05 PM 0.3 mL 09/25/2019 Intramuscular   Manufacturer: [employer], [employer]   Lot: [id number]   NDC: [address]-6

## 2020-01-27 ENCOUNTER — Ambulatory Visit: Payer: BC Managed Care – PPO | Attending: Internal Medicine

## 2020-01-27 DIAGNOSIS — Z23 Encounter for immunization: Secondary | ICD-10-CM

## 2020-01-27 NOTE — Progress Notes (Signed)
   Covid-19 Vaccination Clinic  Name:  Theresa Mcclure    MRN: [id number] DOB: 09-27-1971  01/27/2020  Theresa Mcclure was observed post Covid-19 immunization for 15 minutes without incident. She was provided with Vaccine Information Sheet and instruction to access the V-Safe system.   Theresa Mcclure was instructed to call 911 with any severe reactions post vaccine: Marland Kitchen Difficulty breathing  . Swelling of face and throat  . A fast heartbeat  . A bad rash all over body  . Dizziness and weakness   Immunizations Administered    Name Date Dose VIS Date Route   Pfizer COVID-19 Vaccine 01/27/2020  9:22 AM 0.3 mL 09/25/2019 Intramuscular   Manufacturer: [employer], [employer]   Lot: [id number]   NDC: [address]-5

## 2020-04-01 DIAGNOSIS — Z20822 Contact with and (suspected) exposure to covid-19: Secondary | ICD-10-CM | POA: Diagnosis not present

## 2020-04-25 DIAGNOSIS — E559 Vitamin D deficiency, unspecified: Secondary | ICD-10-CM | POA: Diagnosis not present

## 2020-04-25 DIAGNOSIS — Z13220 Encounter for screening for lipoid disorders: Secondary | ICD-10-CM | POA: Diagnosis not present

## 2020-04-25 DIAGNOSIS — R7303 Prediabetes: Secondary | ICD-10-CM | POA: Diagnosis not present

## 2020-04-25 DIAGNOSIS — M2550 Pain in unspecified joint: Secondary | ICD-10-CM | POA: Diagnosis not present

## 2020-05-18 DIAGNOSIS — M7711 Lateral epicondylitis, right elbow: Secondary | ICD-10-CM | POA: Diagnosis not present

## 2020-05-18 DIAGNOSIS — M2550 Pain in unspecified joint: Secondary | ICD-10-CM | POA: Diagnosis not present

## 2020-05-18 DIAGNOSIS — R7982 Elevated C-reactive protein (CRP): Secondary | ICD-10-CM | POA: Diagnosis not present

## 2020-05-18 DIAGNOSIS — R5383 Other fatigue: Secondary | ICD-10-CM | POA: Diagnosis not present

## 2020-06-15 DIAGNOSIS — E790 Hyperuricemia without signs of inflammatory arthritis and tophaceous disease: Secondary | ICD-10-CM | POA: Diagnosis not present

## 2020-06-15 DIAGNOSIS — M150 Primary generalized (osteo)arthritis: Secondary | ICD-10-CM | POA: Diagnosis not present

## 2020-06-15 DIAGNOSIS — M2550 Pain in unspecified joint: Secondary | ICD-10-CM | POA: Diagnosis not present

## 2020-07-18 ENCOUNTER — Other Ambulatory Visit: Payer: BC Managed Care – PPO

## 2020-07-18 DIAGNOSIS — Z20822 Contact with and (suspected) exposure to covid-19: Secondary | ICD-10-CM | POA: Diagnosis not present

## 2020-07-20 LAB — NOVEL CORONAVIRUS, NAA: SARS-CoV-2, NAA: NOT DETECTED

## 2020-07-20 LAB — SARS-COV-2, NAA 2 DAY TAT

## 2020-09-30 ENCOUNTER — Ambulatory Visit: Payer: BC Managed Care – PPO | Attending: Internal Medicine

## 2020-09-30 DIAGNOSIS — Z23 Encounter for immunization: Secondary | ICD-10-CM

## 2020-09-30 NOTE — Progress Notes (Signed)
   Covid-19 Vaccination Clinic  Name:  Theresa Mcclure    MRN: [id number] DOB: [birth year]  09/30/2020  [name] was observed post Covid-19 immunization for 15 minutes without incident. She was provided with Vaccine Information Sheet and instruction to access the V-Safe system.   [name] was instructed to call 911 with any severe reactions post vaccine: Marland Kitchen Difficulty breathing  . Swelling of face and throat  . A fast heartbeat  . A bad rash all over body  . Dizziness and weakness   Immunizations Administered    Name Date Dose VIS Date Route   Pfizer COVID-19 Vaccine 09/30/2020  3:07 PM 0.3 mL 08/03/2020 Intramuscular   Manufacturer: [employer], [employer]   Lot: [id number]   NDC: [address]-4

## 2021-03-20 ENCOUNTER — Encounter: Payer: Self-pay | Admitting: Emergency Medicine

## 2021-03-20 ENCOUNTER — Ambulatory Visit
Admission: EM | Admit: 2021-03-20 | Discharge: 2021-03-20 | Disposition: A | Discharge status: Home / Self Care | Payer: BC Managed Care – PPO | Attending: Emergency Medicine | Admitting: Emergency Medicine

## 2021-03-20 ENCOUNTER — Other Ambulatory Visit: Payer: Self-pay

## 2021-03-20 DIAGNOSIS — U071 COVID-19: Secondary | ICD-10-CM | POA: Diagnosis not present

## 2021-03-20 DIAGNOSIS — J209 Acute bronchitis, unspecified: Secondary | ICD-10-CM

## 2021-03-20 MED ORDER — ALBUTEROL SULFATE HFA 108 (90 BASE) MCG/ACT IN AERS: 1.0000 | INHALATION_SPRAY | Freq: Four times a day (QID) | RESPIRATORY_TRACT | 0 refills | Status: AC | PRN

## 2021-03-20 MED ORDER — BENZONATATE 200 MG PO CAPS: 200.0000 mg | ORAL_CAPSULE | Freq: Three times a day (TID) | ORAL | 0 refills | Status: AC | PRN

## 2021-03-20 MED ORDER — PROMETHAZINE-DM 6.25-15 MG/5ML PO SYRP: 5.0000 mL | ORAL_SOLUTION | Freq: Every evening | ORAL | 0 refills | Status: DC | PRN

## 2021-03-20 MED ORDER — PREDNISONE 20 MG PO TABS: 40.0000 mg | ORAL_TABLET | Freq: Every day | ORAL | 0 refills | Status: AC

## 2021-03-20 NOTE — ED Provider Notes (Signed)
[facility]    CSN: [id number] Arrival date & time: 03/20/21  1539      History   Chief Complaint Chief Complaint  Patient presents with  . Cough    HPI Theresa Mcclure is a 50 y.o. female presenting today for evaluation of shortness of breath and wheezing in setting of COVID.  Recently tested positive for COVID approximately 5 days ago.  Chills and body aches have resolved, but over the past 1 to 2 days has developed slightly worsening cough with chest discomfort wheezing and shortness of breath.  Reports history of childhood asthma, but denies problems as an adult.  HPI  Past Medical History:  Diagnosis Date  . Anemia    "slightly"  . Asthma    childhood  . Pneumonia 1994    Patient Active Problem List   Diagnosis Date Noted  . Pelvic pain in female 05/01/2011    Class: Acute  . Hematometra 05/01/2011    Past Surgical History:  Procedure Laterality Date  . ABDOMINAL HYSTERECTOMY    . CESAREAN SECTION  1995  . NOVASURE ABLATION  2011  . RHINOPLASTY  2003  . TUBAL LIGATION  2011    OB History   No obstetric history on file.      Home Medications    Prior to Admission medications   Medication Sig Start Date End Date Taking? Authorizing Provider  albuterol (VENTOLIN HFA) 108 (90 Base) MCG/ACT inhaler Inhale 1-2 puffs into the lungs every 6 (six) hours as needed for wheezing or shortness of breath. 03/20/21  Yes Johnni Wunschel C, PA-C  benzonatate (TESSALON) 200 MG capsule Take 1 capsule (200 mg total) by mouth 3 (three) times daily as needed for up to 7 days for cough. 03/20/21 03/27/21 Yes Jerico Grisso C, PA-C  predniSONE (DELTASONE) 20 MG tablet Take 2 tablets (40 mg total) by mouth daily with breakfast for 5 days. 03/20/21 03/25/21 Yes Sharla Tankard C, PA-C  promethazine-dextromethorphan (PROMETHAZINE-DM) 6.25-15 MG/5ML syrup Take 5 mLs by mouth at bedtime as needed for cough. 03/20/21  Yes Nikola Marone C, PA-C  Dextromethorphan-Guaifenesin  (MUCINEX DM PO) Take 1 tablet by mouth 1 day or 1 dose. Pt took medication for two days.     [provider]  ibuprofen (ADVIL,MOTRIN) 600 MG tablet Take 600 mg by mouth every 6 (six) hours as needed.      [provider]    Family History History reviewed. No pertinent family history.  Social History Social History   Tobacco Use  . Smoking status: Never Smoker  . Smokeless tobacco: Never Used  Substance Use Topics  . Alcohol use: Yes    Alcohol/week: 1.0 standard drink    Types: 1 Glasses of wine per week  . Drug use: No     Allergies   Patient has no known allergies.   Review of Systems Review of Systems  Constitutional: Negative for activity change, appetite change, chills, fatigue and fever.  HENT: Positive for congestion and rhinorrhea. Negative for ear pain, sinus pressure, sore throat and trouble swallowing.   Eyes: Negative for discharge and redness.  Respiratory: Positive for cough, chest tightness, shortness of breath and wheezing.   Cardiovascular: Negative for chest pain.  Gastrointestinal: Negative for abdominal pain, diarrhea, nausea and vomiting.  Musculoskeletal: Negative for myalgias.  Skin: Negative for rash.  Neurological: Negative for dizziness, light-headedness and headaches.     Physical Exam Triage Vital Signs ED Triage Vitals [03/20/21 1843]  Enc Vitals Group  BP 130/85     Pulse Rate 95     Resp 18     Temp 98.3 F (36.8 C)     Temp Source Oral     SpO2 97 %     Weight      Height      Head Circumference      Peak Flow      Pain Score 4     Pain Loc      Pain Edu?      Excl. in GC?    No data found.  Updated Vital Signs BP 130/85 (BP Location: Left Arm)   Pulse 95   Temp 98.3 F (36.8 C) (Oral)   Resp 18   LMP 04/13/2011   SpO2 97%   Visual Acuity Right Eye Distance:   Left Eye Distance:   Bilateral Distance:    Right Eye Near:   Left Eye Near:    Bilateral Near:     Physical Exam Vitals and  nursing note reviewed.  Constitutional:      Appearance: She is well-developed.     Comments: No acute distress  HENT:     Head: Normocephalic and atraumatic.     Ears:     Comments: Bilateral ears without tenderness to palpation of external auricle, tragus and mastoid, EAC's without erythema or swelling, TM's with good bony landmarks and cone of light. Non erythematous.     Nose: Nose normal.     Mouth/Throat:     Comments: Oral mucosa pink and moist, no tonsillar enlargement or exudate. Posterior pharynx patent and nonerythematous, no uvula deviation or swelling. Normal phonation. Eyes:     Conjunctiva/sclera: Conjunctivae normal.  Cardiovascular:     Rate and Rhythm: Normal rate and regular rhythm.  Pulmonary:     Effort: Pulmonary effort is normal. No respiratory distress.     Comments: Breathing comfortably at rest, mild coarseness on expiration and faint end expiratory wheezing bilaterally Abdominal:     General: There is no distension.  Musculoskeletal:        General: Normal range of motion.     Cervical back: Neck supple.  Skin:    General: Skin is warm and dry.  Neurological:     Mental Status: She is alert and oriented to person, place, and time.      UC Treatments / Results  Labs (all labs ordered are listed, but only abnormal results are displayed) Labs Reviewed - No data to display  EKG   Radiology No results found.  Procedures Procedures (including critical care time)  Medications Ordered in UC Medications - No data to display  Initial Impression / Assessment and Plan / UC Course  I have reviewed the triage vital signs and the nursing notes.  Pertinent labs & imaging results that were available during my care of the patient were reviewed by me and considered in my medical decision making (see chart for details).     Bronchitis in setting of COVID, prednisone and albuterol, Tessalon with Mucinex for daytime cough, Phenergan DM at nighttime.  Rest  and fluids.  Continue to monitor progress resolution of symptoms and continue to monitor breathing.  O2 stable.  Discussed strict return precautions. Patient verbalized understanding and is agreeable with plan.  Final Clinical Impressions(s) / UC Diagnoses   Final diagnoses:  COVID-19  Acute bronchitis, unspecified organism     Discharge Instructions     Begin prednisone daily x5 days-take with food and earlier in the  day if possible Albuterol inhaler 1 to 2 puffs every 4-6 hours as needed for shortness of breath, chest tightness and wheezing Tessalon every 8 hours for cough during the day, may use with Mucinex Phenergan DM syrup at bedtime Rest and fluids Follow-up if not improving or worsening    ED Prescriptions    Medication Sig Dispense Auth. Provider   predniSONE (DELTASONE) 20 MG tablet Take 2 tablets (40 mg total) by mouth daily with breakfast for 5 days. 10 tablet April Colter C, PA-C   benzonatate (TESSALON) 200 MG capsule Take 1 capsule (200 mg total) by mouth 3 (three) times daily as needed for up to 7 days for cough. 28 capsule Adelheid Hoggard C, PA-C   albuterol (VENTOLIN HFA) 108 (90 Base) MCG/ACT inhaler Inhale 1-2 puffs into the lungs every 6 (six) hours as needed for wheezing or shortness of breath. 1 each Leiland Mihelich C, PA-C   promethazine-dextromethorphan (PROMETHAZINE-DM) 6.25-15 MG/5ML syrup Take 5 mLs by mouth at bedtime as needed for cough. 118 mL [place] Hagger, [place] C, PA-C     PDMP not reviewed this encounter.   Lew Dawes, [place] 03/20/21 2102

## 2021-03-20 NOTE — Discharge Instructions (Addendum)
Begin prednisone daily x5 days-take with food and earlier in the day if possible Albuterol inhaler 1 to 2 puffs every 4-6 hours as needed for shortness of breath, chest tightness and wheezing Tessalon every 8 hours for cough during the day, may use with Mucinex Phenergan DM syrup at bedtime Rest and fluids Follow-up if not improving or worsening

## 2021-03-20 NOTE — ED Triage Notes (Signed)
Pt sts she tested positive for covid last Wednesday and now having increased cough and pain with cough; pt sts some wheezing earlier

## 2021-06-14 DIAGNOSIS — Z1231 Encounter for screening mammogram for malignant neoplasm of breast: Secondary | ICD-10-CM | POA: Diagnosis not present

## 2021-06-14 DIAGNOSIS — B373 Candidiasis of vulva and vagina: Secondary | ICD-10-CM | POA: Diagnosis not present

## 2021-06-14 DIAGNOSIS — N898 Other specified noninflammatory disorders of vagina: Secondary | ICD-10-CM | POA: Diagnosis not present

## 2021-10-13 ENCOUNTER — Other Ambulatory Visit: Payer: Self-pay

## 2021-10-13 ENCOUNTER — Ambulatory Visit
Admission: EM | Admit: 2021-10-13 | Discharge: 2021-10-13 | Disposition: A | Discharge status: Home / Self Care | Payer: BC Managed Care – PPO | Attending: Student | Admitting: Student

## 2021-10-13 ENCOUNTER — Ambulatory Visit (INDEPENDENT_AMBULATORY_CARE_PROVIDER_SITE_OTHER): Payer: BC Managed Care – PPO

## 2021-10-13 DIAGNOSIS — S99922A Unspecified injury of left foot, initial encounter: Secondary | ICD-10-CM | POA: Diagnosis not present

## 2021-10-13 DIAGNOSIS — M79675 Pain in left toe(s): Secondary | ICD-10-CM | POA: Diagnosis not present

## 2021-10-13 NOTE — ED Provider Notes (Signed)
[facility]    CSN: [id number] Arrival date & time: 10/13/21  0805      History   Chief Complaint Chief Complaint  Patient presents with   left pinky toe injury    HPI Theresa Mcclure is a 50 y.o. female presenting with left pinky toe pain following stubbing her toe on hamper 1 day ago.  Medical history noncontributory, denies history of issues  with this foot in the past.  Mild pain at rest, worsens with ambulation or bearing weight.  Denies sensation changes.  HPI  Past Medical History:  Diagnosis Date   Anemia    "slightly"   Asthma    childhood   Pneumonia 1994    Patient Active Problem List   Diagnosis Date Noted   Pelvic pain in female 05/01/2011    Class: Acute   Hematometra 05/01/2011    Past Surgical History:  Procedure Laterality Date   ABDOMINAL HYSTERECTOMY     CESAREAN SECTION  1995   NOVASURE ABLATION  2011   RHINOPLASTY  2003   TUBAL LIGATION  2011    OB History   No obstetric history on file.      Home Medications    Prior to Admission medications   Medication Sig Start Date End Date Taking? Authorizing Provider  albuterol (VENTOLIN HFA) 108 (90 Base) MCG/ACT inhaler Inhale 1-2 puffs into the lungs every 6 (six) hours as needed for wheezing or shortness of breath. 03/20/21   Wieters, Hallie C, PA-C  ibuprofen (ADVIL,MOTRIN) 600 MG tablet Take 600 mg by mouth every 6 (six) hours as needed.      [provider]    Family History History reviewed. No pertinent family history.  Social History Social History   Tobacco Use   Smoking status: Never   Smokeless tobacco: Never  Substance Use Topics   Alcohol use: Yes    Alcohol/week: 1.0 standard drink    Types: 1 Glasses of wine per week   Drug use: No     Allergies   Patient has no known allergies.   Review of Systems Review of Systems  Musculoskeletal:        L foot pain  All other systems reviewed and are negative.   Physical Exam Triage Vital  Signs ED Triage Vitals [10/13/21 0818]  Enc Vitals Group     BP (!) 148/88     Pulse Rate 81     Resp 18     Temp 97.7 F (36.5 C)     Temp Source Oral     SpO2 98 %     Weight      Height      Head Circumference      Peak Flow      Pain Score 5     Pain Loc      Pain Edu?      Excl. in GC?    No data found.  Updated Vital Signs BP (!) 148/88 (BP Location: Left Arm)    Pulse 81    Temp 97.7 F (36.5 C) (Oral)    Resp 18    LMP 04/13/2011    SpO2 98%   Visual Acuity Right Eye Distance:   Left Eye Distance:   Bilateral Distance:    Right Eye Near:   Left Eye Near:    Bilateral Near:     Physical Exam Vitals reviewed.  Constitutional:      General: She is not in acute distress.  Appearance: Normal appearance. She is not ill-appearing.  HENT:     Head: Normocephalic and atraumatic.  Pulmonary:     Effort: Pulmonary effort is normal.  Musculoskeletal:     Comments: L foot- no skin changes or effusion. No midfoot, malleolar, or metatarsal pain. TTP distal aspect L little toe. ROM toes intact and with minimal pain over little toe. Ambulating with pain.   Neurological:     General: No focal deficit present.     Mental Status: She is alert and oriented to person, place, and time.  Psychiatric:        Mood and Affect: Mood normal.        Behavior: Behavior normal.        Thought Content: Thought content normal.        Judgment: Judgment normal.     UC Treatments / Results  Labs (all labs ordered are listed, but only abnormal results are displayed) Labs Reviewed - No data to display  EKG   Radiology DG Toe 5th Left  Result Date: 10/13/2021 CLINICAL DATA:  Injury to the left fifth toe with pain. EXAM: DG TOE 5TH LEFT COMPARISON:  None. FINDINGS: The exam is limited due to the toes held in flexion. There is question lucency in the lateral aspect of the distal fifth phalanx. Fracture is not excluded. There is no dislocation. IMPRESSION: The exam is limited due  to the toes held in flexion. There is question lucency in the lateral aspect of the distal fifth phalanx. Fracture is not excluded. Electronically Signed   By: Sherian Rein M.D.   On: 10/13/2021 08:56    Procedures Procedures (including critical care time)  Medications Ordered in UC Medications - No data to display  Initial Impression / Assessment and Plan / UC Course  I have reviewed the triage vital signs and the nursing notes.  Pertinent labs & imaging results that were available during my care of the patient were reviewed by me and considered in my medical decision making (see chart for details).     This patient is a very pleasant 50 y.o. year old female presenting with possible L little toe fracture x1 day. Neurovascularly intact.   Xray L foot- The exam is limited due to the toes held in flexion. There is question lucency in the lateral aspect of the distal fifth phalanx. Fracture is not excluded.  Placed in post-op shoe, f/u with ortho in 1 week. RICE.  ED return precautions discussed. Patient verbalizes understanding and agreement.     Final Clinical Impressions(s) / UC Diagnoses   Final diagnoses:  Foot injury, left, initial encounter     Discharge Instructions      -Your x-rays showed a possible tiny fracture at the tip of your left pinky toe.  Please use the shoe until you follow-up with orthopedist in about 1 week. -You can take Tylenol up to 1000 mg 3 times daily, and ibuprofen up to 600 mg 3 times daily with food.  You can take these together, or alternate every 3-4 hours. -Rest, ice, elevation -Follow-up with an orthopedist in about 1 week. I recommend [facility] at [address] [place], [place] [address]. You can schedule an appointment by calling [phone]) or online ([email or web address]), but they also have a walk-in clinic M-F 8a-8p and Sat 10a-3p.      ED Prescriptions   None    PDMP not reviewed this encounter.   Rhys Martini,  PA-C 10/13/21 [phone]

## 2021-10-13 NOTE — Discharge Instructions (Addendum)
-  Your x-rays showed a possible tiny fracture at the tip of your left pinky toe.  Please use the shoe until you follow-up with orthopedist in about 1 week. -You can take Tylenol up to 1000 mg 3 times daily, and ibuprofen up to 600 mg 3 times daily with food.  You can take these together, or alternate every 3-4 hours. -Rest, ice, elevation -Follow-up with an orthopedist in about 1 week. I recommend [facility] at [address], [place], [place] [address]. You can schedule an appointment by calling [phone]) or online ([email or web address]), but they also have a walk-in clinic M-F 8a-8p and Sat 10a-3p.

## 2021-10-13 NOTE — ED Triage Notes (Signed)
Pt c/o injury to lest foot 5th digit after hitting it against object last night. States it mildly hurts constantly but worsens with pressure on the toe.

## 2021-10-23 DIAGNOSIS — Z0001 Encounter for general adult medical examination with abnormal findings: Secondary | ICD-10-CM | POA: Diagnosis not present

## 2021-10-23 DIAGNOSIS — Z23 Encounter for immunization: Secondary | ICD-10-CM | POA: Diagnosis not present

## 2021-10-23 DIAGNOSIS — E7800 Pure hypercholesterolemia, unspecified: Secondary | ICD-10-CM | POA: Diagnosis not present

## 2021-10-23 DIAGNOSIS — R7303 Prediabetes: Secondary | ICD-10-CM | POA: Diagnosis not present

## 2021-10-23 DIAGNOSIS — E559 Vitamin D deficiency, unspecified: Secondary | ICD-10-CM | POA: Diagnosis not present

## 2022-04-16 ENCOUNTER — Emergency Department (HOSPITAL_COMMUNITY): Payer: Commercial Managed Care - HMO

## 2022-04-16 ENCOUNTER — Other Ambulatory Visit: Payer: Self-pay

## 2022-04-16 ENCOUNTER — Emergency Department (HOSPITAL_COMMUNITY)
Admission: EM | Admit: 2022-04-16 | Discharge: 2022-04-17 | Discharge status: Against Medical Advice | Payer: Commercial Managed Care - HMO | Attending: Emergency Medicine | Admitting: Emergency Medicine

## 2022-04-16 ENCOUNTER — Encounter (HOSPITAL_COMMUNITY): Payer: Self-pay

## 2022-04-16 DIAGNOSIS — M79602 Pain in left arm: Secondary | ICD-10-CM | POA: Insufficient documentation

## 2022-04-16 DIAGNOSIS — Z5321 Procedure and treatment not carried out due to patient leaving prior to being seen by health care provider: Secondary | ICD-10-CM | POA: Diagnosis not present

## 2022-04-16 DIAGNOSIS — R079 Chest pain, unspecified: Secondary | ICD-10-CM | POA: Diagnosis present

## 2022-04-16 LAB — I-STAT BETA HCG BLOOD, ED (MC, WL, AP ONLY): I-stat hCG, quantitative: <5 m[IU]/mL (ref <5)

## 2022-04-16 LAB — CBC
HCT: 36.9 % (ref 36.0–46.0)
Hemoglobin: 11.7 g/dL — ABNORMAL LOW (ref 12.0–15.0)
MCH: 29.3 pg (ref 26.0–34.0)
MCHC: 31.7 g/dL (ref 30.0–36.0)
MCV: 92.5 fL (ref 80.0–100.0)
Platelets: 293 10*3/uL (ref 150–400)
RBC: 3.99 MIL/uL (ref 3.87–5.11)
RDW: 14.2 % (ref 11.5–15.5)
WBC: 7.6 10*3/uL (ref 4.0–10.5)
nRBC: 0 % (ref 0.0–0.2)

## 2022-04-16 NOTE — ED Triage Notes (Signed)
Pt reports with chest pain and left arm pain that started 90 minutes ago. Pt states that the pain is more like pressure and has mostly resolved.

## 2022-04-17 LAB — BASIC METABOLIC PANEL
Anion gap: 8 (ref 5–15)
BUN: 19 mg/dL (ref 6–20)
CO2: 22 mmol/L (ref 22–32)
Calcium: 9.4 mg/dL (ref 8.9–10.3)
Chloride: 111 mmol/L (ref 98–111)
Creatinine, Ser: 1.14 mg/dL — ABNORMAL HIGH (ref 0.44–1.00)
GFR, Estimated: 58 mL/min — ABNORMAL LOW (ref >60)
Glucose, Bld: 120 mg/dL — ABNORMAL HIGH (ref 70–99)
Potassium: 4.3 mmol/L (ref 3.5–5.1)
Sodium: 141 mmol/L (ref 135–145)

## 2022-04-17 LAB — TROPONIN I (HIGH SENSITIVITY): Troponin I (High Sensitivity): <2 ng/L (ref <18)

## 2022-04-17 NOTE — ED Notes (Signed)
Pt told registration that she was leaving.  

## 2023-03-13 IMAGING — DX DG TOE 5TH 2+V*L*
3 series · 3 of 3 positions shown · non-contrast
Comparison: None.

CLINICAL DATA: Injury to the left fifth toe with pain.

EXAM:
DG TOE 5TH LEFT

[toes dp (1 of 2)]
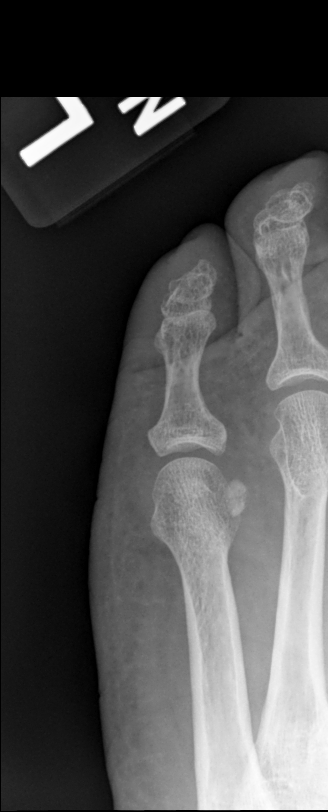

[toes dp (2 of 2)]
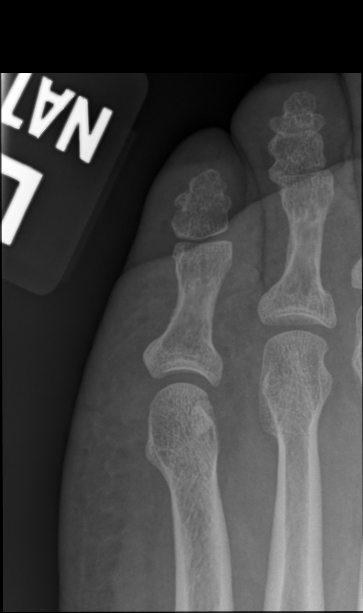

[toes lat]
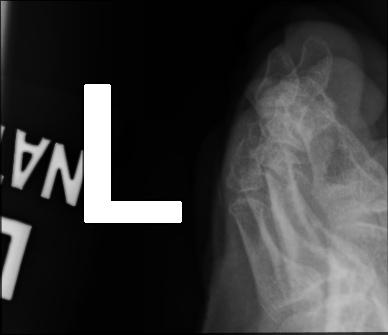

[3 of 3 positions shown; findings below may reference images not displayed]

FINDINGS: The exam is limited due to the toes held in flexion. There is
question lucency in the lateral aspect of the distal fifth phalanx.
Fracture is not excluded. There is no dislocation.
IMPRESSION: The exam is limited due to the toes held in flexion. There is
question lucency in the lateral aspect of the distal fifth phalanx.
Fracture is not excluded.

## 2023-04-12 DIAGNOSIS — M25561 Pain in right knee: Secondary | ICD-10-CM | POA: Diagnosis not present

## 2023-04-26 DIAGNOSIS — M25561 Pain in right knee: Secondary | ICD-10-CM | POA: Diagnosis not present

## 2023-05-10 DIAGNOSIS — Z1211 Encounter for screening for malignant neoplasm of colon: Secondary | ICD-10-CM | POA: Diagnosis not present

## 2023-05-10 DIAGNOSIS — K648 Other hemorrhoids: Secondary | ICD-10-CM | POA: Diagnosis not present

## 2023-05-10 DIAGNOSIS — D123 Benign neoplasm of transverse colon: Secondary | ICD-10-CM | POA: Diagnosis not present

## 2023-08-22 DIAGNOSIS — Z6834 Body mass index (BMI) 34.0-34.9, adult: Secondary | ICD-10-CM | POA: Diagnosis not present

## 2023-08-22 DIAGNOSIS — M7651 Patellar tendinitis, right knee: Secondary | ICD-10-CM | POA: Diagnosis not present

## 2023-08-22 DIAGNOSIS — M7652 Patellar tendinitis, left knee: Secondary | ICD-10-CM | POA: Diagnosis not present

## 2023-11-20 DIAGNOSIS — G4700 Insomnia, unspecified: Secondary | ICD-10-CM | POA: Diagnosis not present

## 2023-11-20 DIAGNOSIS — E559 Vitamin D deficiency, unspecified: Secondary | ICD-10-CM | POA: Diagnosis not present

## 2023-11-20 DIAGNOSIS — R0789 Other chest pain: Secondary | ICD-10-CM | POA: Diagnosis not present

## 2023-11-20 DIAGNOSIS — R7303 Prediabetes: Secondary | ICD-10-CM | POA: Diagnosis not present

## 2023-11-20 DIAGNOSIS — L750 Bromhidrosis: Secondary | ICD-10-CM | POA: Diagnosis not present

## 2023-11-20 DIAGNOSIS — Z0000 Encounter for general adult medical examination without abnormal findings: Secondary | ICD-10-CM | POA: Diagnosis not present

## 2023-11-20 DIAGNOSIS — E7800 Pure hypercholesterolemia, unspecified: Secondary | ICD-10-CM | POA: Diagnosis not present

## 2023-12-20 DIAGNOSIS — Z6835 Body mass index (BMI) 35.0-35.9, adult: Secondary | ICD-10-CM | POA: Diagnosis not present

## 2023-12-20 DIAGNOSIS — E7849 Other hyperlipidemia: Secondary | ICD-10-CM | POA: Diagnosis not present

## 2023-12-20 DIAGNOSIS — N951 Menopausal and female climacteric states: Secondary | ICD-10-CM | POA: Diagnosis not present

## 2023-12-20 DIAGNOSIS — R5382 Chronic fatigue, unspecified: Secondary | ICD-10-CM | POA: Diagnosis not present

## 2023-12-20 DIAGNOSIS — E559 Vitamin D deficiency, unspecified: Secondary | ICD-10-CM | POA: Diagnosis not present

## 2023-12-20 DIAGNOSIS — R7303 Prediabetes: Secondary | ICD-10-CM | POA: Diagnosis not present

## 2023-12-25 DIAGNOSIS — Z6836 Body mass index (BMI) 36.0-36.9, adult: Secondary | ICD-10-CM | POA: Diagnosis not present

## 2023-12-25 DIAGNOSIS — Z1331 Encounter for screening for depression: Secondary | ICD-10-CM | POA: Diagnosis not present

## 2023-12-25 DIAGNOSIS — N951 Menopausal and female climacteric states: Secondary | ICD-10-CM | POA: Diagnosis not present

## 2023-12-25 DIAGNOSIS — R5382 Chronic fatigue, unspecified: Secondary | ICD-10-CM | POA: Diagnosis not present

## 2023-12-25 DIAGNOSIS — F5101 Primary insomnia: Secondary | ICD-10-CM | POA: Diagnosis not present

## 2024-01-01 DIAGNOSIS — Z6836 Body mass index (BMI) 36.0-36.9, adult: Secondary | ICD-10-CM | POA: Diagnosis not present

## 2024-01-01 DIAGNOSIS — R5382 Chronic fatigue, unspecified: Secondary | ICD-10-CM | POA: Diagnosis not present

## 2024-01-08 DIAGNOSIS — E7849 Other hyperlipidemia: Secondary | ICD-10-CM | POA: Diagnosis not present

## 2024-01-08 DIAGNOSIS — Z6835 Body mass index (BMI) 35.0-35.9, adult: Secondary | ICD-10-CM | POA: Diagnosis not present

## 2024-01-15 DIAGNOSIS — R7303 Prediabetes: Secondary | ICD-10-CM | POA: Diagnosis not present

## 2024-01-15 DIAGNOSIS — Z6835 Body mass index (BMI) 35.0-35.9, adult: Secondary | ICD-10-CM | POA: Diagnosis not present

## 2024-01-22 DIAGNOSIS — E7849 Other hyperlipidemia: Secondary | ICD-10-CM | POA: Diagnosis not present

## 2024-01-22 DIAGNOSIS — Z6835 Body mass index (BMI) 35.0-35.9, adult: Secondary | ICD-10-CM | POA: Diagnosis not present

## 2024-01-27 DIAGNOSIS — Z6835 Body mass index (BMI) 35.0-35.9, adult: Secondary | ICD-10-CM | POA: Diagnosis not present

## 2024-01-27 DIAGNOSIS — E039 Hypothyroidism, unspecified: Secondary | ICD-10-CM | POA: Diagnosis not present

## 2024-01-27 DIAGNOSIS — N951 Menopausal and female climacteric states: Secondary | ICD-10-CM | POA: Diagnosis not present

## 2024-02-04 DIAGNOSIS — Z6835 Body mass index (BMI) 35.0-35.9, adult: Secondary | ICD-10-CM | POA: Diagnosis not present

## 2024-02-04 DIAGNOSIS — R5382 Chronic fatigue, unspecified: Secondary | ICD-10-CM | POA: Diagnosis not present

## 2024-02-13 DIAGNOSIS — E7849 Other hyperlipidemia: Secondary | ICD-10-CM | POA: Diagnosis not present

## 2024-02-13 DIAGNOSIS — Z6834 Body mass index (BMI) 34.0-34.9, adult: Secondary | ICD-10-CM | POA: Diagnosis not present

## 2024-02-19 DIAGNOSIS — Z6834 Body mass index (BMI) 34.0-34.9, adult: Secondary | ICD-10-CM | POA: Diagnosis not present

## 2024-02-19 DIAGNOSIS — E559 Vitamin D deficiency, unspecified: Secondary | ICD-10-CM | POA: Diagnosis not present

## 2024-02-19 DIAGNOSIS — E039 Hypothyroidism, unspecified: Secondary | ICD-10-CM | POA: Diagnosis not present

## 2024-03-04 ENCOUNTER — Encounter: Payer: Self-pay | Admitting: Emergency Medicine

## 2024-03-04 ENCOUNTER — Ambulatory Visit: Admission: EM | Admit: 2024-03-04 | Discharge: 2024-03-04 | Disposition: A | Discharge status: Home / Self Care

## 2024-03-04 DIAGNOSIS — L209 Atopic dermatitis, unspecified: Secondary | ICD-10-CM | POA: Insufficient documentation

## 2024-03-04 DIAGNOSIS — H6693 Otitis media, unspecified, bilateral: Secondary | ICD-10-CM | POA: Diagnosis not present

## 2024-03-04 DIAGNOSIS — R051 Acute cough: Secondary | ICD-10-CM

## 2024-03-04 DIAGNOSIS — M069 Rheumatoid arthritis, unspecified: Secondary | ICD-10-CM | POA: Insufficient documentation

## 2024-03-04 DIAGNOSIS — R509 Fever, unspecified: Secondary | ICD-10-CM

## 2024-03-04 DIAGNOSIS — J069 Acute upper respiratory infection, unspecified: Secondary | ICD-10-CM

## 2024-03-04 MED ORDER — ACETAMINOPHEN 325 MG PO TABS
975.0000 mg | ORAL_TABLET | Freq: Once | ORAL | Status: AC
  Administered 2024-03-04: 975 mg via ORAL

## 2024-03-04 MED ORDER — AMOXICILLIN-POT CLAVULANATE 875-125 MG PO TABS: 1.0000 | ORAL_TABLET | Freq: Two times a day (BID) | ORAL | 0 refills | Status: AC

## 2024-03-04 MED ORDER — ALBUTEROL SULFATE HFA 108 (90 BASE) MCG/ACT IN AERS: 2.0000 | INHALATION_SPRAY | RESPIRATORY_TRACT | 0 refills | Status: AC | PRN

## 2024-03-04 MED ORDER — PROMETHAZINE-DM 6.25-15 MG/5ML PO SYRP: 5.0000 mL | ORAL_SOLUTION | Freq: Four times a day (QID) | ORAL | 0 refills | Status: AC | PRN

## 2024-03-04 NOTE — ED Provider Notes (Signed)
 [facility]    CSN: [id number] Arrival date & time: 03/04/24  1531      History   Chief Complaint Chief Complaint  Patient presents with   Nasal Congestion   Cough   Headache    HPI Theresa Mcclure is a 53 y.o. female.   Patient is here for evaluation of nasal congestion, sore throat, productive cough, chest discomfort with the cough, and now onset of fever.  States the symptoms started this past Saturday.  Now her face is hurting diffusely through her cheekbones and left side of her forehead.  She has used Nyquil and Mucinex to help manage the symptoms.  No reported respiratory difficulty, abdominal pain, vomiting, or diarrhea.    The history is provided by the patient.  Cough Associated symptoms: fever, headaches and sore throat   Associated symptoms: no chest pain, no chills, no myalgias, no rash and no shortness of breath   Headache Associated symptoms: congestion, cough, fever, sinus pressure and sore throat   Associated symptoms: no abdominal pain, no diarrhea, no dizziness, no eye pain, no myalgias and no vomiting     Past Medical History:  Diagnosis Date   Anemia    "slightly"   Asthma    childhood   Pneumonia 1994    Patient Active Problem List   Diagnosis Date Noted   Atopic dermatitis 03/04/2024   Rheumatoid arthritis (HCC) 03/04/2024   Pelvic pain in female 05/01/2011    Class: Acute   Hematometra 05/01/2011    Past Surgical History:  Procedure Laterality Date   ABDOMINAL HYSTERECTOMY     CESAREAN SECTION  1995   NOVASURE ABLATION  2011   RHINOPLASTY  2003   TUBAL LIGATION  2011    OB History   No obstetric history on file.      Home Medications    Prior to Admission medications   Medication Sig Start Date End Date Taking? Authorizing Provider  albuterol  (VENTOLIN  HFA) 108 (90 Base) MCG/ACT inhaler Inhale 2 puffs into the lungs every 4 (four) hours as needed for wheezing or shortness of breath. 03/04/24  Yes Genene Kennel,  FNP  ALPRAZolam (XANAX) 0.5 MG tablet TAKE 1 TABLET BY MOUTH 3 TIMES A DAY AS NEEDED SLEEP OR ANXIETY 10/09/13  Yes [provider]  amoxicillin-clavulanate (AUGMENTIN) 875-125 MG tablet Take 1 tablet by mouth every 12 (twelve) hours for 10 days. 03/04/24 03/14/24 Yes Genene Kennel, FNP  hydrOXYzine (ATARAX) 10 MG tablet  12/22/23  Yes [provider]  omeprazole (PRILOSEC) 20 MG capsule Take 1 capsule by mouth daily. 08/08/14  Yes [provider]  progesterone  (PROMETRIUM ) 100 MG capsule  12/25/23  Yes [provider]  promethazine -dextromethorphan (PROMETHAZINE -DM) 6.25-15 MG/5ML syrup Take 5 mLs by mouth every 6 (six) hours as needed for cough. 03/04/24  Yes Genene Kennel, FNP  albuterol  (VENTOLIN  HFA) 108 (90 Base) MCG/ACT inhaler Inhale 1-2 puffs into the lungs every 6 (six) hours as needed for wheezing or shortness of breath. 03/20/21   Wieters, Hallie C, PA-C  buPROPion (WELLBUTRIN SR) 150 MG 12 hr tablet 1 tablet in the morning Orally Once a day; Duration: 30 day(s)    [provider]  escitalopram (LEXAPRO) 10 MG tablet Take 10 mg by mouth.    [provider]  ferrous sulfate 325 (65 FE) MG tablet Take 325 mg by mouth.    [provider]  ibuprofen (ADVIL,MOTRIN) 600 MG tablet Take 600 mg by mouth every  6 (six) hours as needed.      [provider]  meloxicam (MOBIC) 15 MG tablet Take 15 mg by mouth.    [provider]  valACYclovir (VALTREX) 1000 MG tablet 1 tablet Orally Once a day; Duration: 10 day(s)    [provider]  Vitamin D, Ergocalciferol, (DRISDOL) 1.25 MG (50000 UNIT) CAPS capsule Take 50,000 Units by mouth.    [provider]    Family History History reviewed. No pertinent family history.  Social History Social History   Tobacco Use   Smoking status: Never    Passive exposure: Never   Smokeless tobacco: Never  Vaping Use   Vaping status: Never Used  Substance Use Topics    Alcohol use: Yes    Alcohol/week: 1.0 standard drink of alcohol    Types: 1 Glasses of wine per week   Drug use: No     Allergies   Patient has no known allergies.   Review of Systems Review of Systems  Constitutional:  Positive for fever. Negative for chills.  HENT:  Positive for congestion, sinus pressure, sinus pain and sore throat.   Eyes:  Negative for pain and redness.  Respiratory:  Positive for cough and chest tightness. Negative for shortness of breath.   Cardiovascular:  Negative for chest pain and palpitations.  Gastrointestinal:  Negative for abdominal pain, diarrhea and vomiting.  Genitourinary:  Negative for dysuria.  Musculoskeletal:  Negative for arthralgias and myalgias.  Skin:  Negative for rash.  Neurological:  Positive for headaches. Negative for dizziness.    Physical Exam Triage Vital Signs ED Triage Vitals  Encounter Vitals Group     BP 03/04/24 1727 108/76     Systolic BP Percentile --      Diastolic BP Percentile --      Pulse Rate 03/04/24 1727 (!) 105     Resp 03/04/24 1727 20     Temp 03/04/24 1727 (!) 101.6 F (38.7 C)     Temp Source 03/04/24 1727 Oral     SpO2 03/04/24 1727 97 %     Weight 03/04/24 1726 154 lb 15.7 oz (70.3 kg)     Height --      Head Circumference --      Peak Flow --      Pain Score 03/04/24 1724 2     Pain Loc --      Pain Education --      Exclude from Growth Chart --    No data found.  Updated Vital Signs BP 108/76 (BP Location: Left Arm)   Pulse (!) 105   Temp (!) 101.6 F (38.7 C) (Oral)   Resp 20   Wt 154 lb 15.7 oz (70.3 kg)   LMP 04/13/2011   SpO2 97%   Physical Exam Vitals and nursing note reviewed.  Constitutional:      Appearance: She is well-developed.  HENT:     Head: Normocephalic.     Comments: Diffuse maxillary sinus pressure bilaterally and left frontal sinus pressure.    Right Ear: Ear canal normal.     Left Ear: Ear canal normal.     Ears:     Comments: Bilateral tympanic  membranes are bulging, slightly erythematous, L>R.    Nose: Congestion present.     Mouth/Throat:     Mouth: Mucous membranes are moist.     Pharynx: No posterior oropharyngeal erythema.  Eyes:     Extraocular Movements: Extraocular movements intact.     Conjunctiva/sclera:  Conjunctivae normal.  Cardiovascular:     Rate and Rhythm: Normal rate and regular rhythm.     Heart sounds: Normal heart sounds.  Pulmonary:     Effort: Pulmonary effort is normal.     Breath sounds: Normal breath sounds.  Abdominal:     General: Bowel sounds are normal.  Skin:    General: Skin is warm and dry.  Neurological:     General: No focal deficit present.     Mental Status: She is alert and oriented to person, place, and time.  Psychiatric:        Mood and Affect: Mood normal.        Behavior: Behavior normal.        Thought Content: Thought content normal.        Judgment: Judgment normal.    UC Treatments / Results  Labs (all labs ordered are listed, but only abnormal results are displayed) Labs Reviewed - No data to display  EKG   Radiology No results found.  Procedures Procedures (including critical care time)  Medications Ordered in UC Medications  acetaminophen  (TYLENOL ) tablet 975 mg (975 mg Oral Given 03/04/24 1804)    Initial Impression / Assessment and Plan / UC Course  I have reviewed the triage vital signs and the nursing notes.  Pertinent labs & imaging results that were available during my care of the patient were reviewed by me and considered in my medical decision making (see chart for details).     Patient presents for evaluation of URI symptoms and a cough that has been ongoing since Saturday.  The symptoms are intensifying and she is now febrile.  Based upon these findings and history, reasonable to provide antibiotic therapy.  I've also prescribed a cough syrup and Albuterol  inhaler to facilitate symptom management/resolution.  Discussed adequate rest/oral  hydration.  No indication for chest x-ray today - however, if her cough persists/worsens, would consider upon re-evaluation.  May continue to use OTC medications such as ibuprofen, tylenol .  Final Clinical Impressions(s) / UC Diagnoses   Final diagnoses:  Acute upper respiratory infection  Acute cough  Acute bilateral otitis media  Fever, unspecified     Discharge Instructions      An antibiotic has been prescribed to treat your upper respiratory infection and ear infection. A cough syrup has been prescribed to help you rest at night The inhaler will help alleviate your chest tightness. Ensure adequate rest and oral hydration.  Use Tylenol /Motrin for the fever.   ED Prescriptions     Medication Sig Dispense Auth. Provider   amoxicillin-clavulanate (AUGMENTIN) 875-125 MG tablet Take 1 tablet by mouth every 12 (twelve) hours for 10 days. 20 tablet Genene Kennel, FNP   promethazine -dextromethorphan (PROMETHAZINE -DM) 6.25-15 MG/5ML syrup Take 5 mLs by mouth every 6 (six) hours as needed for cough. 118 mL Genene Kennel, FNP   albuterol  (VENTOLIN  HFA) 108 (90 Base) MCG/ACT inhaler Inhale 2 puffs into the lungs every 4 (four) hours as needed for wheezing or shortness of breath. 18 g Genene Kennel, FNP      PDMP not reviewed this encounter.   Genene Kennel, FNP 03/04/24 [phone]

## 2024-03-04 NOTE — Discharge Instructions (Addendum)
 An antibiotic has been prescribed to treat your upper respiratory infection and ear infection. A cough syrup has been prescribed to help you rest at night The inhaler will help alleviate your chest tightness. Ensure adequate rest and oral hydration.  Use Tylenol /Motrin for the fever.

## 2024-03-04 NOTE — ED Triage Notes (Signed)
 Pt c/o sxs listed since Saturday. Pt also c/o fever several times  in the last couple days. Pt denies emesis and diarrhea. No dizziness. SOB after coughing.

## 2024-03-13 DIAGNOSIS — N951 Menopausal and female climacteric states: Secondary | ICD-10-CM | POA: Diagnosis not present

## 2024-03-19 DIAGNOSIS — E7849 Other hyperlipidemia: Secondary | ICD-10-CM | POA: Diagnosis not present

## 2024-04-10 DIAGNOSIS — E7849 Other hyperlipidemia: Secondary | ICD-10-CM | POA: Diagnosis not present

## 2024-04-24 DIAGNOSIS — Z6832 Body mass index (BMI) 32.0-32.9, adult: Secondary | ICD-10-CM | POA: Diagnosis not present

## 2024-04-24 DIAGNOSIS — E88819 Insulin resistance, unspecified: Secondary | ICD-10-CM | POA: Diagnosis not present

## 2024-05-08 DIAGNOSIS — R5382 Chronic fatigue, unspecified: Secondary | ICD-10-CM | POA: Diagnosis not present

## 2024-05-08 DIAGNOSIS — Z6832 Body mass index (BMI) 32.0-32.9, adult: Secondary | ICD-10-CM | POA: Diagnosis not present

## 2024-05-28 DIAGNOSIS — R7309 Other abnormal glucose: Secondary | ICD-10-CM | POA: Diagnosis not present

## 2024-05-28 DIAGNOSIS — R079 Chest pain, unspecified: Secondary | ICD-10-CM | POA: Diagnosis not present

## 2024-05-28 DIAGNOSIS — R109 Unspecified abdominal pain: Secondary | ICD-10-CM | POA: Diagnosis not present

## 2024-06-11 DIAGNOSIS — E7849 Other hyperlipidemia: Secondary | ICD-10-CM | POA: Diagnosis not present

## 2024-07-02 DIAGNOSIS — Z6830 Body mass index (BMI) 30.0-30.9, adult: Secondary | ICD-10-CM | POA: Diagnosis not present

## 2024-07-02 DIAGNOSIS — E559 Vitamin D deficiency, unspecified: Secondary | ICD-10-CM | POA: Diagnosis not present

## 2024-07-16 DIAGNOSIS — R7309 Other abnormal glucose: Secondary | ICD-10-CM | POA: Diagnosis not present

## 2024-08-05 DIAGNOSIS — F5101 Primary insomnia: Secondary | ICD-10-CM | POA: Diagnosis not present

## 2024-09-02 DIAGNOSIS — R7303 Prediabetes: Secondary | ICD-10-CM | POA: Diagnosis not present

## 2024-09-16 ENCOUNTER — Ambulatory Visit: Admission: EM | Admit: 2024-09-16 | Discharge: 2024-09-16 | Disposition: A | Discharge status: Home / Self Care

## 2024-09-16 DIAGNOSIS — B9789 Other viral agents as the cause of diseases classified elsewhere: Secondary | ICD-10-CM | POA: Diagnosis not present

## 2024-09-16 DIAGNOSIS — J069 Acute upper respiratory infection, unspecified: Secondary | ICD-10-CM | POA: Diagnosis not present

## 2024-09-16 DIAGNOSIS — J019 Acute sinusitis, unspecified: Secondary | ICD-10-CM

## 2024-09-16 LAB — POC COVID19/FLU A&B COMBO
Covid Antigen, POC: NEGATIVE
Influenza A Antigen, POC: NEGATIVE
Influenza B Antigen, POC: NEGATIVE

## 2024-09-16 NOTE — ED Triage Notes (Signed)
 Pt states sinus pressure to her face for the past 3 days.  States she has been taking Nyquil at home.

## 2024-09-16 NOTE — ED Provider Notes (Signed)
 [facility]    CSN: [id number] Arrival date & time: 09/16/24  0847      History   Chief Complaint Chief Complaint  Patient presents with   Facial Pain    HPI Theresa Mcclure is a 53 y.o. female.   Pt presents today due to 3 days of nasal congestion, cough, body aches, and sinus pressure.  Patient states that she has been taking NyQuil for symptoms with an appreciable relief.  Patient denies known sick contacts.  Patient denies fever, nausea, or vomiting.  The history is provided by the patient.    Past Medical History:  Diagnosis Date   Anemia    slightly   Asthma    childhood   Pneumonia 1994    Patient Active Problem List   Diagnosis Date Noted   Atopic dermatitis 03/04/2024   Rheumatoid arthritis (HCC) 03/04/2024   Pelvic pain in female 05/01/2011    Class: Acute   Hematometra 05/01/2011    Past Surgical History:  Procedure Laterality Date   ABDOMINAL HYSTERECTOMY     CESAREAN SECTION  1995   NOVASURE ABLATION  2011   RHINOPLASTY  2003   TUBAL LIGATION  2011    OB History   No obstetric history on file.      Home Medications    Prior to Admission medications   Medication Sig Start Date End Date Taking? Authorizing Provider  albuterol  (VENTOLIN  HFA) 108 (90 Base) MCG/ACT inhaler Inhale 1-2 puffs into the lungs every 6 (six) hours as needed for wheezing or shortness of breath. 03/20/21   Wieters, Hallie C, PA-C  albuterol  (VENTOLIN  HFA) 108 (90 Base) MCG/ACT inhaler Inhale 2 puffs into the lungs every 4 (four) hours as needed for wheezing or shortness of breath. 03/04/24   Janet Therisa PARAS, FNP  ALPRAZolam (XANAX) 0.5 MG tablet TAKE 1 TABLET BY MOUTH 3 TIMES A DAY AS NEEDED SLEEP OR ANXIETY 10/09/13   [provider]  buPROPion (WELLBUTRIN SR) 150 MG 12 hr tablet 1 tablet in the morning Orally Once a day; Duration: 30 day(s)    [provider]  escitalopram (LEXAPRO) 10 MG tablet Take 10 mg by mouth.    [provider]  ferrous sulfate 325 (65 FE) MG tablet Take 325 mg by mouth.    [provider]  hydrOXYzine (ATARAX) 10 MG tablet  12/22/23   [provider]  ibuprofen (ADVIL,MOTRIN) 600 MG tablet Take 600 mg by mouth every 6 (six) hours as needed.      [provider]  meloxicam (MOBIC) 15 MG tablet Take 15 mg by mouth.    [provider]  omeprazole (PRILOSEC) 20 MG capsule Take 1 capsule by mouth daily. 08/08/14   [provider]  progesterone  (PROMETRIUM ) 100 MG capsule  12/25/23   [provider]  promethazine -dextromethorphan (PROMETHAZINE -DM) 6.25-15 MG/5ML syrup Take 5 mLs by mouth every 6 (six) hours as needed for cough. 03/04/24   Janet Therisa PARAS, FNP  valACYclovir (VALTREX) 1000 MG tablet 1 tablet Orally Once a day; Duration: 10 day(s)    [provider]  Vitamin D, Ergocalciferol, (DRISDOL) 1.25 MG (50000 UNIT) CAPS capsule Take 50,000 Units by mouth.    [provider]    Family History History reviewed. No pertinent family history.  Social History Social History   Tobacco Use   Smoking status: Never    Passive exposure: Never   Smokeless tobacco: Never  Vaping Use   Vaping status: Never  Used  Substance Use Topics   Alcohol use: Yes    Alcohol/week: 1.0 standard drink of alcohol    Types: 1 Glasses of wine per week   Drug use: No     Allergies   Patient has no known allergies.   Review of Systems Review of Systems   Physical Exam Triage Vital Signs ED Triage Vitals [09/16/24 0900]  Encounter Vitals Group     BP 108/77     Girls Systolic BP Percentile      Girls Diastolic BP Percentile      Boys Systolic BP Percentile      Boys Diastolic BP Percentile      Pulse Rate (!) 102     Resp 16     Temp 98.4 F (36.9 C)     Temp Source Oral     SpO2 96 %     Weight      Height      Head Circumference      Peak Flow      Pain Score 7     Pain Loc      Pain Education      Exclude from Growth Chart     No data found.  Updated Vital Signs BP 108/77 (BP Location: Left Arm)   Pulse (!) 102   Temp 98.4 F (36.9 C) (Oral)   Resp 16   LMP 04/13/2011   SpO2 96%   Visual Acuity Right Eye Distance:   Left Eye Distance:   Bilateral Distance:    Right Eye Near:   Left Eye Near:    Bilateral Near:     Physical Exam Vitals and nursing note reviewed.  Constitutional:      General: She is not in acute distress.    Appearance: Normal appearance. She is not ill-appearing, toxic-appearing or diaphoretic.  HENT:     Nose: Congestion (moderately enlarged turbinates) present. No rhinorrhea.     Right Sinus: Maxillary sinus tenderness present. No frontal sinus tenderness.     Left Sinus: Maxillary sinus tenderness present. No frontal sinus tenderness.     Comments: Mild tenderness to palpation of upper lip    Mouth/Throat:     Mouth: Mucous membranes are moist.     Pharynx: Oropharynx is clear. No oropharyngeal exudate or posterior oropharyngeal erythema.  Eyes:     General: No scleral icterus. Cardiovascular:     Rate and Rhythm: Normal rate and regular rhythm.     Heart sounds: Normal heart sounds.  Pulmonary:     Effort: Pulmonary effort is normal. No respiratory distress.     Breath sounds: Normal breath sounds. No wheezing or rhonchi.  Skin:    General: Skin is warm.  Neurological:     Mental Status: She is alert and oriented to person, place, and time.  Psychiatric:        Mood and Affect: Mood normal.        Behavior: Behavior normal.      UC Treatments / Results  Labs (all labs ordered are listed, but only abnormal results are displayed) Labs Reviewed  POC COVID19/FLU A&B COMBO    EKG   Radiology No results found.  Procedures Procedures (including critical care time)  Medications Ordered in UC Medications - No data to display  Initial Impression / Assessment and Plan / UC Course  I have reviewed the triage vital signs and the nursing notes.  Pertinent  labs & imaging results that were available during my care  of the patient were reviewed by me and considered in my medical decision making (see chart for details).    Final Clinical Impressions(s) / UC Diagnoses   Final diagnoses:  Viral URI   Discharge Instructions   None    ED Prescriptions   None    PDMP not reviewed this encounter.   Andra Corean BROCKS, PA-C 09/16/24 [phone]

## 2024-09-16 NOTE — Discharge Instructions (Signed)

## 2024-10-01 DIAGNOSIS — E559 Vitamin D deficiency, unspecified: Secondary | ICD-10-CM | POA: Diagnosis not present
